# Patient Record
Sex: Male | Born: 1945 | Race: White | Hispanic: No | Marital: Married | Smoking: Former smoker
Health system: Southern US, Community
[De-identification: ages and names within clinical notes are randomized; demographics above are authoritative.]

## PROBLEM LIST (undated history)

## (undated) DIAGNOSIS — I10 Essential (primary) hypertension: Secondary | ICD-10-CM

## (undated) DIAGNOSIS — G8929 Other chronic pain: Secondary | ICD-10-CM

## (undated) DIAGNOSIS — M1990 Unspecified osteoarthritis, unspecified site: Secondary | ICD-10-CM

## (undated) DIAGNOSIS — T7840XA Allergy, unspecified, initial encounter: Secondary | ICD-10-CM

## (undated) DIAGNOSIS — C4490 Unspecified malignant neoplasm of skin, unspecified: Secondary | ICD-10-CM

## (undated) DIAGNOSIS — K5790 Diverticulosis of intestine, part unspecified, without perforation or abscess without bleeding: Secondary | ICD-10-CM

## (undated) DIAGNOSIS — R109 Unspecified abdominal pain: Secondary | ICD-10-CM

## (undated) DIAGNOSIS — E785 Hyperlipidemia, unspecified: Secondary | ICD-10-CM

## (undated) DIAGNOSIS — K589 Irritable bowel syndrome without diarrhea: Secondary | ICD-10-CM

## (undated) DIAGNOSIS — I2510 Atherosclerotic heart disease of native coronary artery without angina pectoris: Secondary | ICD-10-CM

## (undated) DIAGNOSIS — I714 Abdominal aortic aneurysm, without rupture, unspecified: Secondary | ICD-10-CM

## (undated) DIAGNOSIS — J45909 Unspecified asthma, uncomplicated: Secondary | ICD-10-CM

## (undated) DIAGNOSIS — K219 Gastro-esophageal reflux disease without esophagitis: Secondary | ICD-10-CM

## (undated) DIAGNOSIS — H269 Unspecified cataract: Secondary | ICD-10-CM

## (undated) DIAGNOSIS — K635 Polyp of colon: Secondary | ICD-10-CM

## (undated) DIAGNOSIS — Z8679 Personal history of other diseases of the circulatory system: Secondary | ICD-10-CM

## (undated) HISTORY — PX: KNEE SURGERY: SHX244

## (undated) HISTORY — DX: Atherosclerotic heart disease of native coronary artery without angina pectoris: I25.10

## (undated) HISTORY — DX: Other chronic pain: G89.29

## (undated) HISTORY — PX: CHOLECYSTECTOMY: SHX55

## (undated) HISTORY — DX: Essential (primary) hypertension: I10

## (undated) HISTORY — PX: EXPLORATORY LAPAROTOMY: SUR591

## (undated) HISTORY — DX: Unspecified asthma, uncomplicated: J45.909

## (undated) HISTORY — DX: Gastro-esophageal reflux disease without esophagitis: K21.9

## (undated) HISTORY — DX: Abdominal aortic aneurysm, without rupture, unspecified: I71.40

## (undated) HISTORY — DX: Unspecified malignant neoplasm of skin, unspecified: C44.90

## (undated) HISTORY — DX: Personal history of other diseases of the circulatory system: Z86.79

## (undated) HISTORY — DX: Unspecified abdominal pain: R10.9

## (undated) HISTORY — PX: EYE SURGERY: SHX253

## (undated) HISTORY — DX: Unspecified cataract: H26.9

## (undated) HISTORY — DX: Unspecified osteoarthritis, unspecified site: M19.90

## (undated) HISTORY — PX: TENDON REPAIR: SHX5111

## (undated) HISTORY — DX: Abdominal aortic aneurysm, without rupture: I71.4

## (undated) HISTORY — DX: Hyperlipidemia, unspecified: E78.5

## (undated) HISTORY — PX: POLYPECTOMY: SHX149

## (undated) HISTORY — PX: COLONOSCOPY: SHX174

## (undated) HISTORY — DX: Polyp of colon: K63.5

## (undated) HISTORY — DX: Allergy, unspecified, initial encounter: T78.40XA

## (undated) HISTORY — DX: Diverticulosis of intestine, part unspecified, without perforation or abscess without bleeding: K57.90

## (undated) HISTORY — PX: COLONOSCOPY: SHX5424

## (undated) HISTORY — DX: Irritable bowel syndrome, unspecified: K58.9

---

## 1995-06-09 HISTORY — PX: CARDIAC CATHETERIZATION: SHX172

## 1995-06-09 HISTORY — PX: CORONARY ANGIOPLASTY WITH STENT PLACEMENT: SHX49

## 1997-12-13 ENCOUNTER — Inpatient Hospital Stay (HOSPITAL_COMMUNITY): Admission: EM | Admit: 1997-12-13 | Discharge: 1997-12-14 | Payer: Self-pay | Admitting: Cardiology

## 1998-01-03 ENCOUNTER — Ambulatory Visit (HOSPITAL_COMMUNITY): Admission: RE | Admit: 1998-01-03 | Discharge: 1998-01-03 | Payer: Self-pay | Admitting: Internal Medicine

## 1998-02-18 ENCOUNTER — Ambulatory Visit (HOSPITAL_COMMUNITY): Admission: RE | Admit: 1998-02-18 | Discharge: 1998-02-19 | Payer: Self-pay | Admitting: General Surgery

## 1998-05-21 ENCOUNTER — Ambulatory Visit (HOSPITAL_COMMUNITY): Admission: RE | Admit: 1998-05-21 | Discharge: 1998-05-21 | Payer: Self-pay | Admitting: Internal Medicine

## 1999-09-21 ENCOUNTER — Encounter: Payer: Self-pay | Admitting: Internal Medicine

## 1999-09-21 ENCOUNTER — Encounter: Payer: Self-pay | Admitting: Emergency Medicine

## 1999-09-21 ENCOUNTER — Inpatient Hospital Stay (HOSPITAL_COMMUNITY): Admission: EM | Admit: 1999-09-21 | Discharge: 1999-09-23 | Payer: Self-pay | Admitting: Emergency Medicine

## 2005-10-07 ENCOUNTER — Encounter (HOSPITAL_BASED_OUTPATIENT_CLINIC_OR_DEPARTMENT_OTHER): Payer: Self-pay | Admitting: General Surgery

## 2009-03-13 ENCOUNTER — Encounter (INDEPENDENT_AMBULATORY_CARE_PROVIDER_SITE_OTHER): Payer: Self-pay | Admitting: *Deleted

## 2009-10-07 ENCOUNTER — Telehealth: Payer: Self-pay | Admitting: Internal Medicine

## 2010-04-25 ENCOUNTER — Telehealth: Payer: Self-pay | Admitting: Cardiovascular Disease

## 2010-04-25 ENCOUNTER — Encounter: Payer: Self-pay | Admitting: Cardiovascular Disease

## 2010-06-12 ENCOUNTER — Ambulatory Visit
Admission: RE | Admit: 2010-06-12 | Discharge: 2010-06-12 | Payer: Self-pay | Source: Home / Self Care | Attending: Cardiovascular Disease | Admitting: Cardiovascular Disease

## 2010-07-10 NOTE — Progress Notes (Signed)
Summary: need information faxed to Delbert Harness  Phone Note Call from Patient Call back at [facility] Phone [phone]   Caller: Patient Summary of Call: Delbert Harness  [phone] pt need information fax so he can have a MRI and pt also needs a card to carry with him at all times. Initial call taken by: Judie Grieve,  April 25, 2010 11:41 AM  Follow-up for Phone Call        MRI request given to DOD. adv pt that order for mri not signed and will have md sign on monday. pt states he had been seeing Dr. Sherril Croon in  [place] but he has since retired. Pt is considering coming back to [place] for care.  Follow-up by: Claris Gladden RN,  April 25, 2010 11:53 AM  Additional Follow-up for Phone Call Additional follow up Details #1::        fax's approval.  Additional Follow-up by: Claris Gladden RN,  April 28, 2010 9:59 AM

## 2010-07-10 NOTE — Progress Notes (Signed)
Summary: Schedule Colonoscopy  Phone Note Outgoing Call Call back at [facility] Phone [phone]   Call placed by: Harlow Mares CMA Duncan Dull),  Oct 07, 2009 4:55 PM Call placed to: Patient Summary of Call: Left message on patients machine to call back. pt needs to schedule his next colonoscopy Initial call taken by: Harlow Mares CMA Duncan Dull),  Oct 07, 2009 4:55 PM  Follow-up for Phone Call        patient is due for colonoscopy. we will mail him a letter as a reminder.  Follow-up by: Harlow Mares CMA Duncan Dull),  Oct 18, 2009 2:55 PM

## 2010-07-10 NOTE — Progress Notes (Signed)
Summary: pt has stents/ok to have mri?  Phone Note Call from Patient   Caller: Patient [phone] Reason for Call: Talk to Nurse Summary of Call: pt calling re having a stent placement over 10 yrs ago-wants to know if it's ok to have an mri Initial call taken by: Glynda Jaeger,  April 25, 2010 11:14 AM  Follow-up for Phone Call        adv pt ok to have mri. he will have orthopedic md call if any questions.  Follow-up by: Claris Gladden RN,  April 25, 2010 11:31 AM     Appended Document: pt has stents/ok to have mri? Stent in coronary over 10 years ago Ok to have MRI

## 2010-07-10 NOTE — Medication Information (Signed)
Summary: Order for MRI  Order for MRI   Imported By: Marylou Mccoy 05/14/2010 12:33:17  _____________________________________________________________________  External Attachment:    Type:   Image     Comment:   External Document

## 2010-09-18 ENCOUNTER — Other Ambulatory Visit: Payer: Self-pay | Admitting: *Deleted

## 2010-09-18 MED ORDER — ROSUVASTATIN CALCIUM 40 MG PO TABS: 40.0000 mg | ORAL_TABLET | Freq: Every day | ORAL | Status: DC

## 2010-10-21 NOTE — Assessment & Plan Note (Signed)
[facility]                        [place] [facility] OFFICE NOTE   MERVILLE, HIJAZI                    MRN:          [id number]  DATE:06/12/2010                            DOB:          02-13-1946    Kevin Blake is a 65 year old gentleman who is here today to establish  cardiovascular care.  He is transferring care from [facility].  He has the following problem list:  1. Coronary artery disease status post angioplasty and stent placement      in 1997.  No cardiac events since then.  Most recent nuclear stress      test was in June 2009 which showed no evidence of ischemia with      normal ejection fraction and exercise capacity.  2. Hypertension.  3. Hyperlipidemia.  4. Gastroesophageal reflux disease.   CLINICAL HISTORY:  Kevin Blake is transferring his cardiac care.  Overall, he has been doing very well.  Since his cardiac angioplasty in  1997, he had no cardiac events.  He is able to do all his activities of  daily living with no limitations.  He is very active.  He has no chest  pain, dyspnea, palpitations.  No presyncope or syncope.   MEDICATIONS:  1. Lisinopril 10 mg once daily.  2. Ranitidine 150 mg once daily.  3. Niaspan 500 mg once daily.  4. Aspirin 81 mg once daily.  5. Crestor 40 mg once daily.  6. Amitriptyline 50 mg once daily.  7. Fish oil 2000 mg twice daily.  8. Calcium 1200 mg twice daily.   ALLERGIES:  He has no true allergies, but NORVASC cause lower extremity  edema as well as DESIPRAMINE cause the same problem.  ATENOLOL was  stopped due to bradycardia.   SOCIAL HISTORY:  Remarkable for previous smoking.  He quit in 2002.  He  used to smoke 1 pack per day for many years.  Her denies any alcohol or  recreational drug use.  He is retired from [employer] work.   PAST SURGICAL HISTORY:  Knee surgery, cholecystectomy as well as tendon  repair.   FAMILY HISTORY:  Negative for premature coronary  artery disease.  His  father died of renal cancer.   REVIEW OF SYSTEMS:  Remarkable for only occasional stomach discomfort.  A full review of system was performed and is otherwise negative.   PHYSICAL EXAMINATION:  GENERAL:  The patient appears to be at his stated  age and in no acute distress.  VITAL SIGNS:  Weight is 185 pounds, blood pressure is 138/85, pulse is  70, oxygen saturation is 97% on room air.  HEENT:  Normocephalic, atraumatic.  NECK:  No JVD or carotid bruits.  RESPIRATORY:  Normal respiratory effort with no use of accessory  muscles.  Auscultation reveals normal breath sounds.  CARDIOVASCULAR:  Normal PMI.  Normal S1 and S2 with no gallops or murmurs.  ABDOMEN:  Benign, nontender, nondistended.  EXTREMITIES:  With no clubbing, cyanosis or edema.  SKIN:  Warm and dry with no rash.  PSYCHIATRIC:  He is alert, oriented x3 with normal mood and affect.  MUSCULOSKELETAL:  There is normal muscle strength in the upper and lower  extremities.   An electrocardiogram was performed that showed normal sinus rhythm with  right bundle branch block as well as nonspecific T-wave changes in the  anterior leads.   IMPRESSION:  1. History of coronary artery disease status post angioplasty and      stent placement to the right coronary artery in 1997.  No cardiac      events since then.  Most recent nuclear stress test was in June      2009 which was negative.  He subsequently underwent treadmill      stress test which were unremarkable as well.  At this time I      recommend continuing medical therapy with aspirin 81 mg once daily,      lisinopril 10 mg once daily and lipid management with Crestor and      Niaspan.  2. Hypertension:  Blood pressure is reasonably controlled but slightly      on the high side.  We will consider increasing lisinopril in the      future if needed.  3. Hyperlipidemia:  We will request a fasting lipid and liver profile      and will notify the patient  with the results.  He will follow up      with me in 6 months from now or earlier if needed.     Lorine Bears, MD  Electronically Signed    MA/MedQ  DD: 06/12/2010  DT: 06/12/2010  Job #: [id number]

## 2010-10-24 NOTE — Discharge Summary (Signed)
[place]  Patient:    Kevin Blake, Kevin Blake                    MRN: [id number] Adm. Date:  [id number] Disc. Date: [id number] Attending:  Madaline Guthrie Dictator:   Nolon Nations CC:         Earl Many, M.D.             Einar Crow, M.D.             Dr. Shanon Rosser, [place] [facility])                           Discharge Summary  DATE OF BIRTH: 27-May-1946  CONSULTATIONS: Cardiology.  PROCEDURE: Cardiolite study on September 22, 1999.  HISTORY OF PRESENT ILLNESS: Mr. Deman is a 65 year old male with a history of coronary artery disease who presented to the ED with a four day history of chest pain.  The pain started about four days prior to admission with left-sided chest pain and tightness.  This lasted about an hour.  Two days later he then had pain that lasted all day with tightness and nausea.  The pain came on at rest and was not effected by exercise.  The patient had recurrent chest pain on the morning of admission.  He took a nitroglycerin without much relief.  The pain became so severe that his family called EMS and he was transported to the hospital via EMS.  He denied any associated diaphoresis, dizziness, radiation of pain.  He described the pain as gas-like and tight.  The pain is similar to pain that he has had associated with coronary artery disease as well as with gastrointestinal problems in the past.  The patient has a long history of gastrointestinal problems, that have been diagnosed as IBS.  He has had a number of GI work-ups including CT scan, upper GI endoscopy x 2, colonoscopy x 2, as well as barium swallow.  These have revealed no significant abnormalities.  The patient has also had two stents placed in the RCA in 1997.  He had cardiac catheterization in 1997 revealing the right-sided stents were widely patent and the stenotic lesions of the left circumflex, 60-70% lesion in the LAD, were unchanged from  1997.  PAST MEDICAL HISTORY:  1. Coronary artery disease as above.  2. Chronic epigastric pain/IBS.  3. Hypercholesterolemia.  PAST SURGICAL HISTORY:  1. PTCA in 1997.  2. Cardiac catheterization in 1999.  3. 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  4. Biceps tendon repair.  5. Arthroscopic knee surgery.  MEDICATIONS:  1. Metoprolol 25 mg b.i.d.  2. Hyoscyamine 0.125 mg q.4h to q.6h p.r.n.  3. Zoloft 50 mg qd  4. Lipitor 40 mg q.d.  5. Cholestyramine 4 g b.i.d.  6. Aspirin 325 mg q.d.  ALLERGIES: No known drug allergies.  FAMILY HISTORY: Brother with MI in his 8s.  Kidney disease, hyperlipidemia. Aunts and uncles with coronary artery disease.  Parents age 18 and 2, alive and well.  SOCIAL HISTORY: Married, lives with wife in [place].  Works as a Technical sales engineer.  Two children, ages 61 and 69.  Past history of smoking, quit over 20 years ago.  Drinks alcohol occasionally.  No illicit drug use. His 69 year old son is getting married in a couple of months.  PHYSICAL EXAMINATION:  VITAL SIGNS: Temperature 97.9 degrees, blood pressure 134/84, respiratory rate  18, pulse 60.  Oxygen saturation 97% on room air.  GENERAL:  Pleasant middle-aged male, resting comfortably in bed.  HEENT: Head normocephalic, atraumatic.  EOMI.  PERRL.  Conjunctivae clear. Oropharynx patent and moist.  Nares patent.  NECK: Supple, no lymphadenopathy or thyromegaly.  LUNGS: Clear to auscultation bilaterally.  No crackles, wheezes, or rhonchi.  CARDIOVASCULAR: Bradycardia, regular rhythm, normal S1 and S2, no murmurs or rubs. Radial, femoral, dorsalis pedis pulses normal bilaterally.  No JVD.  ABDOMEN: Normoactive bowel sounds, no tenderness, no masses.  EXTREMITIES: No clubbing, cyanosis, or edema.  NEUROLOGIC: Alert and oriented x 4.  Cranial nerves 2-12 intact.  Strength 5/5.  Reflexes 2+ and symmetric.  LABORATORY DATA: Sodium 133, potassium 3.9,  chloride 103, bicarbonate 24, BUN 13, creatinine 1.0, glucose 93.  Calcium 8.6.  WBC 6.4, hemoglobin 15.1, hematocrit 39.8, platelets 189,000.  CK enzymes on admission showed a total of 98 and MB 0.9, relative index 0.9, troponin I less than 0.3.  PT 13.4, INR 1.1, PTT 26 seconds.  EKG showed sinus bradycardia, otherwise normal.  Chest x-ray showed no acute disease.  ASSESSMENT/PLAN: This patient is a 65 year old male with a history of coronary artery disease and chronic epigastric pain, presenting with chest pain.  1. Chest pain.  Must rule out myocardial infarction given history of     coronary artery disease.  Will admit to telemetry and follow cardiac     enzymes, repeat EKG and BMP in the morning.  Continue with home     medications.  Will consult cardiology for work-up.  2. Long history of gastrointestinal problems.  Will continue on home     medications.  Likely source for pain. HOSPITAL COURSE: #1 - CHEST PAIN: The patient was admitted to telemetry and ruled out for MI. He had no arrhythmias and no cardiac problems while in-house.  He had significant improvement in chest pain by the morning of discharge.  Cardiology was consulted and given his cardiac history a stress Cardiolite was ordered. EKG showed no acute changes and no arrhythmia.  He tolerated this well. Cardiology recommendation was for further GI work-up as etiology for chest pain appeared GI and not cardiac in origin.  #2 - GASTROINTESTINAL: Maintained on home medications and the patient was started on Protonix.  Will discharge home on Protonix 40 mg p.o. q.d. with follow-up as an outpatient.  Should this provide relief would recommend long-term treatment with Protonix.  Should it not relieve perhaps Reglan may be of benefit.  DISCHARGE CONDITION: Good.  DISPOSITION: Discharged home.  DISCHARGE MEDICATIONS:  1. Enteric-coated aspirin 325 mg p.o. q.d.  2. Lipitor 40 mg p.o. q.d.   3. Cholestyramine 4 g p.o.  b.i.d.  4. Hyoscyamine 0.125 mg sublingual q.4h to q.6h p.r.n. spasm.  5. Zoloft 50 mg p.o. qd  6. Metoprolol 25 mg p.o. b.i.d.  7. Protonix 40 mg p.o. q.d.  FOLLOW-UP:  1. Follow-up with Dr. Yetta Flock on Oct 09, 1999 at 8:30 a.m.  2. Follow-up with Dr. Sherril Croon to keep previously scheduled appointment.  3. Follow-up with Dr. Chales Abrahams as previously scheduled on September 30, 1999.DD: 09/23/99 TD:  09/23/99 Job: [id number]

## 2010-12-24 ENCOUNTER — Encounter: Payer: Self-pay | Admitting: Cardiovascular Disease

## 2010-12-25 ENCOUNTER — Ambulatory Visit (INDEPENDENT_AMBULATORY_CARE_PROVIDER_SITE_OTHER): Payer: Medicare Other | Admitting: Cardiovascular Disease

## 2010-12-25 ENCOUNTER — Encounter: Payer: Self-pay | Admitting: Cardiovascular Disease

## 2010-12-25 VITALS — BP 129/83 | HR 77 | Ht 68.0 in | Wt 176.0 lb

## 2010-12-25 DIAGNOSIS — I10 Essential (primary) hypertension: Secondary | ICD-10-CM

## 2010-12-25 DIAGNOSIS — I2510 Atherosclerotic heart disease of native coronary artery without angina pectoris: Secondary | ICD-10-CM | POA: Insufficient documentation

## 2010-12-25 DIAGNOSIS — E785 Hyperlipidemia, unspecified: Secondary | ICD-10-CM

## 2010-12-25 NOTE — Assessment & Plan Note (Signed)
His most recent lipid profile in January showed a total cholesterol of 205, triglyceride 106, HDL 48 and an LDL of 136. This was done while he was taking Crestor 40 mg daily as well as niacin 500 mg once daily. I will go ahead and repeat his fasting lipid profile. If his LDL is still above 100, I plan on adding Zakia 10 mg once daily. I discussed with him the importance of healthy diet and regular exercise.

## 2010-12-25 NOTE — Assessment & Plan Note (Signed)
His blood pressure is well controlled. Continue current medications. 

## 2010-12-25 NOTE — Assessment & Plan Note (Signed)
The patient is not having any symptoms suggestive of angina at this time. Continue with medical therapy. Continue aspirin 81 mg daily as well as lipid management. His blood pressure is optimal at this time.

## 2010-12-25 NOTE — Patient Instructions (Signed)
Your physician recommends that you schedule a follow-up appointment in: 6 months  Your physician recommends that you return for lab work today -- lipid and liver profiels

## 2010-12-25 NOTE — Progress Notes (Signed)
HPI  This is a 65 year old gentleman who is here today for a followup visit. He has history of coronary artery disease status post angioplasty and stent placement in 1997. No cardiac events since then. Most recent stress test was in 2009 which showed no evidence of ischemia. He also has history of hypertension and hyperlipidemia. Overall, he has been doing well. He denies any chest pain or dyspnea. His most recent lipid profile was not optimal. He claims that he takes Crestor daily without interruption.  No Known Allergies   Current Outpatient Prescriptions on File Prior to Visit  Medication Sig Dispense Refill  . amitriptyline (ELAVIL) 50 MG tablet Take 50 mg by mouth at bedtime.        Marland Kitchen aspirin 81 MG tablet Take 81 mg by mouth daily.        . Calcium Carbonate-Vit D-Min (CALCIUM 1200 PO) Take by mouth 2 (two) times daily.        . fish oil-omega-3 fatty acids 1000 MG capsule Take 2 g by mouth 2 (two) times daily.        Marland Kitchen lisinopril (PRINIVIL,ZESTRIL) 10 MG tablet Take 10 mg by mouth daily.        . niacin (NIASPAN) 500 MG CR tablet Take 1,000 mg by mouth at bedtime.       . nitroGLYCERIN (NITROSTAT) 0.4 MG SL tablet Place 0.4 mg under the tongue every 5 (five) minutes as needed.        . ranitidine (ZANTAC) 150 MG capsule Take 150 mg by mouth 2 (two) times daily.        . rosuvastatin (CRESTOR) 40 MG tablet Take 1 tablet (40 mg total) by mouth daily.  30 tablet  6     Past Medical History  Diagnosis Date  . GERD (gastroesophageal reflux disease)   . CAD (coronary artery disease)     s/p angioplasty  . HLD (hyperlipidemia)   . HTN (hypertension)      Past Surgical History  Procedure Date  . Knee surgery   . Cholecystectomy   . Tendon repair   . Cardiac catheterization 1997  . Coronary angioplasty with stent placement 1997     Family History  Problem Relation Age of Onset  . Cancer       History   Social History  . Marital Status: Married    Spouse Name: N/A   Number of Children: 2  . Years of Education: N/A   Occupational History  . retired    Social History Main Topics  . Smoking status: Former Smoker -- 1.0 packs/day    Quit date: 06/08/2000  . Smokeless tobacco: Not on file  . Alcohol Use: No  . Drug Use: No  . Sexually Active: Not on file   Other Topics Concern  . Not on file   Social History Narrative  . No narrative on file      PHYSICAL EXAM   BP 129/83  Pulse 77  Ht 5\' 8"  (1.727 m)  Wt 176 lb (79.833 kg)  BMI 26.76 kg/m2  SpO2 94%  Constitutional: He is oriented to person, place, and time. He appears well-developed and well-nourished. No distress.  HENT: No nasal discharge.  Head: Normocephalic and atraumatic.  Eyes: Pupils are equal, round, and reactive to light. Right eye exhibits no discharge. Left eye exhibits no discharge.  Neck: Normal range of motion. Neck supple. No JVD present. No thyromegaly present.  Cardiovascular: Normal rate, regular rhythm, normal heart sounds and intact  distal pulses. Exam reveals no gallop and no friction rub.  No murmur heard.  Pulmonary/Chest: Effort normal and breath sounds normal. No stridor. No respiratory distress. He has no wheezes. He has no rales. He exhibits no tenderness.  Abdominal: Soft. Bowel sounds are normal. He exhibits no distension. There is no tenderness. There is no rebound and no guarding.  Musculoskeletal: Normal range of motion. He exhibits no edema and no tenderness.  Neurological: He is alert and oriented to person, place, and time. Coordination normal.  Skin: Skin is warm and dry. No rash noted. He is not diaphoretic. No erythema. No pallor.  Psychiatric: He has a normal mood and affect. His behavior is normal. Judgment and thought content normal.        ASSESSMENT AND PLAN

## 2010-12-29 ENCOUNTER — Encounter: Payer: Self-pay | Admitting: Cardiovascular Disease

## 2011-01-19 ENCOUNTER — Other Ambulatory Visit: Payer: Self-pay | Admitting: *Deleted

## 2011-01-19 MED ORDER — LISINOPRIL 10 MG PO TABS: 10.0000 mg | ORAL_TABLET | Freq: Every day | ORAL | Status: DC

## 2011-02-12 ENCOUNTER — Other Ambulatory Visit: Payer: Self-pay | Admitting: *Deleted

## 2011-02-12 MED ORDER — NIACIN ER (ANTIHYPERLIPIDEMIC) 500 MG PO TBCR: 1000.0000 mg | EXTENDED_RELEASE_TABLET | Freq: Every day | ORAL | Status: DC

## 2011-04-29 ENCOUNTER — Other Ambulatory Visit: Payer: Self-pay | Admitting: Cardiovascular Disease

## 2011-04-29 MED ORDER — ROSUVASTATIN CALCIUM 40 MG PO TABS: 40.0000 mg | ORAL_TABLET | Freq: Every day | ORAL | Status: DC

## 2011-06-16 ENCOUNTER — Ambulatory Visit (INDEPENDENT_AMBULATORY_CARE_PROVIDER_SITE_OTHER): Payer: Medicare Other | Admitting: Cardiovascular Disease

## 2011-06-16 ENCOUNTER — Encounter: Payer: Self-pay | Admitting: Cardiovascular Disease

## 2011-06-16 DIAGNOSIS — Z79899 Other long term (current) drug therapy: Secondary | ICD-10-CM

## 2011-06-16 DIAGNOSIS — E785 Hyperlipidemia, unspecified: Secondary | ICD-10-CM

## 2011-06-16 DIAGNOSIS — I10 Essential (primary) hypertension: Secondary | ICD-10-CM

## 2011-06-16 DIAGNOSIS — I2510 Atherosclerotic heart disease of native coronary artery without angina pectoris: Secondary | ICD-10-CM

## 2011-06-16 LAB — HEPATIC FUNCTION PANEL
ALT: 17 U/L (ref 0–53)
AST: 24 U/L (ref 0–37)
Alkaline Phosphatase: 60 U/L (ref 39–117)
Total Bilirubin: 0.7 mg/dL (ref 0.3–1.2)

## 2011-06-16 LAB — LIPID PANEL
HDL: 41.6 mg/dL (ref >39.00)
Total CHOL/HDL Ratio: 4
Triglycerides: 84 mg/dL (ref 0.0–149.0)

## 2011-06-16 MED ORDER — NITROGLYCERIN 0.4 MG SL SUBL: 0.4000 mg | SUBLINGUAL_TABLET | SUBLINGUAL | Status: DC | PRN

## 2011-06-16 NOTE — Progress Notes (Signed)
This is a 66 year old gentleman who is here today for a followup visit. He has history of coronary artery disease status post angioplasty and stent placement in 1997. No cardiac events since then. Most recent stress test was in 2009 which showed no evidence of ischemia. He also has history of hypertension and hyperlipidemia. Overall, he has been doing well. He denies any chest pain or dyspnea. Las LDL was 100 in July.  He wants repeat blood work today.  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  Sees Dr Maisie Fus in [place] as primary.    ROS: Denies fever, malais, weight loss, blurry vision, decreased visual acuity, cough, sputum, SOB, hemoptysis, pleuritic pain, palpitaitons, heartburn, abdominal pain, melena, lower extremity edema, claudication, or rash.  All other systems reviewed and negative  General: Affect appropriate Healthy:  appears stated age HEENT: normal Neck supple with no adenopathy JVP normal no bruits no thyromegaly Lungs clear with no wheezing and good diaphragmatic motion Heart:  S1/S2 no murmur,rub, gallop or click PMI normal Abdomen: benighn, BS positve, no tenderness, no AAA no bruit.  No HSM or HJR Distal pulses intact with no bruits No edema Neuro non-focal Skin warm and dry No muscular weakness   Current Outpatient Prescriptions  Medication Sig Dispense Refill  . aspirin 81 MG tablet Take 81 mg by mouth daily.        Marland Kitchen azaTHIOprine (IMURAN) 50 MG tablet Take 1 tablet by mouth Twice daily.      . fish oil-omega-3 fatty acids 1000 MG capsule Take 2 g by mouth 2 (two) times daily.        Marland Kitchen lisinopril (PRINIVIL,ZESTRIL) 10 MG tablet Take 1 tablet (10 mg total) by mouth daily.  30 tablet  5  . nitroGLYCERIN (NITROSTAT) 0.4 MG SL tablet Place 0.4 mg under the tongue every 5 (five) minutes as needed.        . rosuvastatin (CRESTOR) 40 MG tablet Take 1 tablet (40 mg total) by mouth daily.  30 tablet  6  . niacin (NIASPAN) 500 MG  CR tablet Take 1,000 mg by mouth at bedtime. (NOT TAKING)         Allergies  Lipitor  Electrocardiogram: NSR rate 61 LAD otherwise normal ECG  Assessment and Plan

## 2011-06-16 NOTE — Assessment & Plan Note (Signed)
Cholesterol is at goal.  Continue current dose of statin and diet Rx.  No myalgias or side effects.  F/U  LFT's in 6 months. No results found for this basename: LDLCALC   Labs today.  LDL 100 7/12

## 2011-06-16 NOTE — Assessment & Plan Note (Signed)
Stable with no angina and good activity level.  Continue medical Rx Nitro called into [place] pharmacy

## 2011-06-16 NOTE — Assessment & Plan Note (Signed)
Well controlled.  Continue current medications and low sodium Dash type diet.    

## 2011-06-16 NOTE — Patient Instructions (Signed)
Your physician wants you to follow-up in: YEAR WITH DR NISHAN  You will receive a reminder letter in the mail two months in advance. If you don't receive a letter, please call our office to schedule the follow-up appointment. Your physician recommends that you continue on your current medications as directed. Please refer to the Current Medication list given to you today.  Your physician recommends that you return for lab work in: TODAY LIPID LIVER  DX [phone] [phone] 

## 2011-06-17 ENCOUNTER — Encounter: Payer: Self-pay | Admitting: *Deleted

## 2011-07-23 ENCOUNTER — Other Ambulatory Visit: Payer: Self-pay | Admitting: Cardiovascular Disease

## 2011-07-27 ENCOUNTER — Other Ambulatory Visit: Payer: Self-pay | Admitting: *Deleted

## 2011-07-27 MED ORDER — LISINOPRIL 10 MG PO TABS: 10.0000 mg | ORAL_TABLET | Freq: Every day | ORAL | Status: DC

## 2011-07-27 NOTE — Telephone Encounter (Signed)
FU Call: Pt calling to check on status of pt lisinopril. Please call this RX in ASAP.

## 2011-12-03 ENCOUNTER — Other Ambulatory Visit: Payer: Self-pay | Admitting: *Deleted

## 2011-12-03 MED ORDER — ROSUVASTATIN CALCIUM 40 MG PO TABS: 40.0000 mg | ORAL_TABLET | Freq: Every day | ORAL | Status: DC

## 2011-12-18 ENCOUNTER — Encounter: Payer: Self-pay | Admitting: Internal Medicine

## 2012-02-05 ENCOUNTER — Other Ambulatory Visit: Payer: Self-pay | Admitting: Cardiovascular Disease

## 2012-02-05 MED ORDER — LISINOPRIL 10 MG PO TABS: 10.0000 mg | ORAL_TABLET | Freq: Every day | ORAL | Status: DC

## 2012-08-09 ENCOUNTER — Other Ambulatory Visit: Payer: Self-pay | Admitting: *Deleted

## 2012-08-09 MED ORDER — LISINOPRIL 10 MG PO TABS: 10.0000 mg | ORAL_TABLET | Freq: Every day | ORAL | Status: DC

## 2012-08-22 ENCOUNTER — Other Ambulatory Visit: Payer: Self-pay | Admitting: *Deleted

## 2012-08-22 MED ORDER — ROSUVASTATIN CALCIUM 40 MG PO TABS: 40.0000 mg | ORAL_TABLET | Freq: Every day | ORAL | Status: DC

## 2012-09-27 ENCOUNTER — Telehealth: Payer: Self-pay | Admitting: Cardiovascular Disease

## 2012-09-27 NOTE — Telephone Encounter (Signed)
SPOKE WITH PT NEEDING  DOCUMENTATION THAT PT HAS CAD  REVIEWED LAST OFFICE NOTE IS MENTIONED  IN DICTATION  WILL MAIL PT COPY OF LETTER AND  IS AWARE .Zack Seal

## 2012-09-27 NOTE — Telephone Encounter (Signed)
New Prob     Pt is thinking about contacting the [place], and he needs a verification that he has heart disease and being treated by Dr. Eden Emms.

## 2013-01-06 ENCOUNTER — Encounter: Payer: Self-pay | Admitting: Cardiovascular Disease

## 2013-01-06 ENCOUNTER — Ambulatory Visit (INDEPENDENT_AMBULATORY_CARE_PROVIDER_SITE_OTHER): Payer: Medicare Other | Admitting: Cardiovascular Disease

## 2013-01-06 VITALS — BP 122/72 | HR 55 | Ht 68.0 in | Wt 180.0 lb

## 2013-01-06 DIAGNOSIS — Z79899 Other long term (current) drug therapy: Secondary | ICD-10-CM

## 2013-01-06 DIAGNOSIS — I10 Essential (primary) hypertension: Secondary | ICD-10-CM

## 2013-01-06 DIAGNOSIS — Z0000 Encounter for general adult medical examination without abnormal findings: Secondary | ICD-10-CM

## 2013-01-06 DIAGNOSIS — E785 Hyperlipidemia, unspecified: Secondary | ICD-10-CM

## 2013-01-06 DIAGNOSIS — I2510 Atherosclerotic heart disease of native coronary artery without angina pectoris: Secondary | ICD-10-CM

## 2013-01-06 DIAGNOSIS — K439 Ventral hernia without obstruction or gangrene: Secondary | ICD-10-CM

## 2013-01-06 LAB — LIPID PANEL
Cholesterol: 162 mg/dL (ref 0–200)
HDL: 43.2 mg/dL (ref >39.00)
Triglycerides: 96 mg/dL (ref 0.0–149.0)
VLDL: 19.2 mg/dL (ref 0.0–40.0)

## 2013-01-06 LAB — HEPATIC FUNCTION PANEL
ALT: 26 U/L (ref 0–53)
AST: 27 U/L (ref 0–37)
Albumin: 4.1 g/dL (ref 3.5–5.2)
Alkaline Phosphatase: 61 U/L (ref 39–117)
Total Protein: 6.9 g/dL (ref 6.0–8.3)

## 2013-01-06 MED ORDER — NITROGLYCERIN 0.4 MG SL SUBL: 0.4000 mg | SUBLINGUAL_TABLET | SUBLINGUAL | Status: DC | PRN

## 2013-01-06 NOTE — Assessment & Plan Note (Signed)
Well controlled.  Continue current medications and low sodium Dash type diet.    

## 2013-01-06 NOTE — Progress Notes (Signed)
Patient ID: Kevin Blake, male   DOB: 02-Apr-1946, 67 y.o.   MRN: [id number] This is a 67 year old gentleman who is here today for a followup visit. He has history of coronary artery disease status post angioplasty and stent placement in 1997. No cardiac events since then. Most recent stress test was in 2009 which showed no evidence of ischemia. He also has history of hypertension and hyperlipidemia. Overall, he has been doing well. He denies any chest pain or dyspnea. Las LDL was 100 in July. He wants repeat blood work today. 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 Sees Dr Maisie Fus in [place] as primary.  Has a small ventral hernia and asking advise about this  ROS: Denies fever, malais, weight loss, blurry vision, decreased visual acuity, cough, sputum, SOB, hemoptysis, pleuritic pain, palpitaitons, heartburn, abdominal pain, melena, lower extremity edema, claudication, or rash.  All other systems reviewed and negative  General: Affect appropriate Healthy:  appears stated age HEENT: normal Neck supple with no adenopathy JVP normal no bruits no thyromegaly Lungs clear with no wheezing and good diaphragmatic motion Heart:  S1/S2 no murmur, no rub, gallop or click PMI normal Abdomen: benighn, BS positve, no tenderness, no AAA small reducable ventral hernia no bruit.  No HSM or HJR Distal pulses intact with no bruits No edema Neuro non-focal Skin warm and dry No muscular weakness   Current Outpatient Prescriptions  Medication Sig Dispense Refill  . aspirin 81 MG tablet Take 81 mg by mouth daily.        . fish oil-omega-3 fatty acids 1000 MG capsule Take 2 g by mouth 2 (two) times daily.        Marland Kitchen lisinopril (PRINIVIL,ZESTRIL) 10 MG tablet Take 1 tablet (10 mg total) by mouth daily.  30 tablet  5  . rosuvastatin (CRESTOR) 40 MG tablet Take 1 tablet (40 mg total) by mouth daily.  30 tablet  6  . nitroGLYCERIN (NITROSTAT) 0.4 MG SL tablet Place 1  tablet (0.4 mg total) under the tongue every 5 (five) minutes as needed.  25 tablet  4   No current facility-administered medications for this visit.    Allergies  Lipitor  Electrocardiogram:  SB rate 55 normal otherwise  Assessment and Plan

## 2013-01-06 NOTE — Assessment & Plan Note (Signed)
Small and reducable no need for surgery now

## 2013-01-06 NOTE — Assessment & Plan Note (Signed)
Refill on nitro called in [place] with no chest pain stable

## 2013-01-06 NOTE — Assessment & Plan Note (Signed)
Labs today Low fat diet  Continue statin

## 2013-01-06 NOTE — Patient Instructions (Signed)
Your physician wants you to follow-up in:   YEAR WITH DR Haywood Filler will receive a reminder letter in the mail two months in advance. If you don't receive a letter, please call our office to schedule the follow-up appointment. Your physician recommends that you continue on your current medications as directed. Please refer to the Current Medication list given to you today. Your physician recommends that you return for lab work in: TODAY FASTING LIPID LIVER  HGBA1C GLUCOSE

## 2013-01-25 ENCOUNTER — Telehealth: Payer: Self-pay | Admitting: Cardiovascular Disease

## 2013-01-25 DIAGNOSIS — I714 Abdominal aortic aneurysm, without rupture, unspecified: Secondary | ICD-10-CM

## 2013-01-25 NOTE — Telephone Encounter (Signed)
New Problem  Pt has had 2 aneurisms in the past or recently and is requesting an ultrasound/ please assist .

## 2013-01-25 NOTE — Telephone Encounter (Signed)
PT  CALLING WOULD  LIKE  YOU  TO FOLLOW  HIS  AAA  PT  THINKS  HE IS DUE FOR  AN ABD U/S  HAD BEEN FOLLOWED IN [place]  PER PT FEELS  HE IS GETTING TH RUN AROUND .Zack Seal

## 2013-01-26 NOTE — Telephone Encounter (Signed)
Can order abdominal US for AAA

## 2013-01-27 NOTE — Telephone Encounter (Signed)
PT AWARE WILL FOLLOW  AAA  ORDER ENTERED  PT AWARE SCHEDULERS TO CALL  WITH  AN APPT .CY

## 2013-02-03 ENCOUNTER — Encounter (INDEPENDENT_AMBULATORY_CARE_PROVIDER_SITE_OTHER): Payer: Medicare Other

## 2013-02-03 DIAGNOSIS — I714 Abdominal aortic aneurysm, without rupture: Secondary | ICD-10-CM

## 2013-02-03 DIAGNOSIS — I700 Atherosclerosis of aorta: Secondary | ICD-10-CM

## 2013-02-03 DIAGNOSIS — I723 Aneurysm of iliac artery: Secondary | ICD-10-CM

## 2013-02-09 ENCOUNTER — Telehealth: Payer: Self-pay | Admitting: Cardiovascular Disease

## 2013-02-09 DIAGNOSIS — I723 Aneurysm of iliac artery: Secondary | ICD-10-CM

## 2013-02-09 NOTE — Telephone Encounter (Signed)
Follow Up  Pt returning calling for ultrasound results.

## 2013-02-09 NOTE — Telephone Encounter (Signed)
Pt aware of results by phone. Will refer the pt to [facility].  The pt has followed with a Vascular doctor in [facility] but has wanted to transition his care to [facility]. I made the pt aware that he will need to obtain his records from previous vascular doctor in preparation for new vascular appointment.

## 2013-02-14 ENCOUNTER — Other Ambulatory Visit: Payer: Self-pay

## 2013-02-14 MED ORDER — LISINOPRIL 10 MG PO TABS: 10.0000 mg | ORAL_TABLET | Freq: Every day | ORAL | Status: DC

## 2013-03-07 ENCOUNTER — Other Ambulatory Visit: Payer: Self-pay | Admitting: Cardiovascular Disease

## 2013-03-07 DIAGNOSIS — I723 Aneurysm of iliac artery: Secondary | ICD-10-CM

## 2013-03-24 ENCOUNTER — Encounter: Payer: Self-pay | Admitting: Surgery

## 2013-03-27 ENCOUNTER — Ambulatory Visit (INDEPENDENT_AMBULATORY_CARE_PROVIDER_SITE_OTHER): Payer: Medicare Other | Admitting: Surgery

## 2013-03-27 ENCOUNTER — Encounter: Payer: Self-pay | Admitting: Surgery

## 2013-03-27 ENCOUNTER — Other Ambulatory Visit (HOSPITAL_COMMUNITY): Payer: Medicare Other

## 2013-03-27 VITALS — BP 162/87 | HR 57 | Ht 68.0 in | Wt 177.7 lb

## 2013-03-27 DIAGNOSIS — Z01810 Encounter for preprocedural cardiovascular examination: Secondary | ICD-10-CM

## 2013-03-27 DIAGNOSIS — I723 Aneurysm of iliac artery: Secondary | ICD-10-CM | POA: Insufficient documentation

## 2013-03-27 DIAGNOSIS — I714 Abdominal aortic aneurysm, without rupture, unspecified: Secondary | ICD-10-CM

## 2013-03-27 DIAGNOSIS — I716 Thoracoabdominal aortic aneurysm, without rupture, unspecified: Secondary | ICD-10-CM

## 2013-03-27 NOTE — Progress Notes (Signed)
[facility]   Patient name: Kevin Blake MRN: [id number] DOB: 1946-05-04 Sex: male   Referred by: Dr. Eden Emms  Reason for referral:  Chief Complaint  Patient presents with  . 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  His aneurysm was detected many years ago and had been followed and [place].  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  The patient denies any abdominal or back pain.  The patient has a history of hypercholesterolemia.  He is currently taking a statin.  He has a history of hypertension which is well-controlled.  He is on an ACE inhibitor.  He suffers from coronary artery disease.  He is status post stenting and 1997.  At that time he also quit smoking.  He is  Past Medical History  Diagnosis Date  . GERD (gastroesophageal reflux disease)   . CAD (coronary artery disease)     s/p angioplasty  . HLD (hyperlipidemia)   . HTN (hypertension)   . Cancer     skin    Past Surgical History  Procedure Laterality Date  . Knee surgery    . Cholecystectomy    . Tendon repair    . Cardiac catheterization  1997  . Coronary angioplasty with stent placement  1997    History   Social History  . Marital Status: Married    Spouse Name: N/A    Number of Children: 2  . Years of Education: N/A   Occupational History  . retired    Social History Main Topics  . Smoking status: Former Smoker -- 1.00 packs/day    Quit date: 06/08/2000  . Smokeless tobacco: Not on file  . Alcohol Use: No  . Drug Use: No  . Sexual Activity: Not on file   Other Topics Concern  . Not on file   Social History Narrative  . No narrative on file    Family History  Problem Relation Age of Onset  . Cancer    . Cancer Mother   . AAA (abdominal aortic aneurysm)  Mother   . Cancer Father   . Heart disease Brother     before age 2  . Hyperlipidemia Brother   . Hypertension Brother   . Heart disease Son     Allergies as of 03/27/2013 - Review Complete 03/27/2013  Allergen Reaction Noted  . Lipitor [atorvastatin calcium]  06/16/2011    Current Outpatient Prescriptions on File Prior to Visit  Medication Sig Dispense Refill  . aspirin 81 MG tablet Take 81 mg by mouth daily.        Marland Kitchen lisinopril (PRINIVIL,ZESTRIL) 10 MG tablet Take 1 tablet (10 mg total) by mouth daily.  30 tablet  5  . nitroGLYCERIN (NITROSTAT) 0.4 MG SL tablet Place 1 tablet (0.4 mg total) under the tongue every 5 (five) minutes as needed.  25 tablet  4  . rosuvastatin (CRESTOR) 40 MG tablet Take 1 tablet (40 mg total) by mouth daily.  30 tablet  6  . fish oil-omega-3 fatty acids 1000 MG capsule Take 2 g by mouth 2 (two) times daily.         No current facility-administered medications on file prior to visit.     REVIEW OF SYSTEMS: Cardiovascular: No chest pain, chest pressure, palpitations,  orthopnea, or dyspnea on exertion. No claudication or rest pain,  No history of DVT or phlebitis. Pulmonary: No productive cough, asthma or wheezing. Neurologic: No weakness, paresthesias, aphasia, or amaurosis. No dizziness. Hematologic: No bleeding problems or clotting disorders. Musculoskeletal: No joint pain or joint swelling. Gastrointestinal: No blood in stool or hematemesis Genitourinary: No dysuria or hematuria. Psychiatric:: No history of major depression. Integumentary: No rashes or ulcers. Constitutional: No fever or chills.  PHYSICAL EXAMINATION: General: The patient appears their stated age.  Vital signs are BP 162/87  Pulse 57  Ht 5\' 8"  (1.727 m)  Wt 177 lb 11.2 oz (80.604 kg)  BMI 27.03 kg/m2  SpO2 100% HEENT:  No gross abnormalities Pulmonary: Respirations are non-labored Abdomen: Soft and non-tender.  Umbilical hernia Musculoskeletal: There are no major  deformities.   Neurologic: No focal weakness or paresthesias are detected, Skin: There are no ulcers  noted.  Positive for rash Psychiatric: The patient has normal affect. Cardiovascular: There is a regular rate and rhythm without significant murmur appreciated.  No carotid bruits.  Palpable bilateral pedal, popliteal, and femoral pulses.  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  Ideally I would like to address the abdominal component at the same time.  The patient will need to undergo CT angiography to better define his anatomy.  In addition I am going to obtain carotid duplex studies as well as lower extremity duplex studies to evaluate him for carotid stenosis and possible lower extremity aneurysmal changes.  The studies will be performed in the immediate future, and the patient will followup with me once they had been completed.     Jorge Ny, M.D. [facility] [place] Office: [phone] Pager:  [phone]

## 2013-03-28 NOTE — Addendum Note (Signed)
Addended by: Sharee Pimple on: 03/28/2013 07:42 AM   Modules accepted: Orders

## 2013-03-29 ENCOUNTER — Other Ambulatory Visit: Payer: Self-pay | Admitting: *Deleted

## 2013-03-29 ENCOUNTER — Other Ambulatory Visit: Payer: Self-pay | Admitting: Cardiovascular Disease

## 2013-03-29 DIAGNOSIS — I739 Peripheral vascular disease, unspecified: Secondary | ICD-10-CM

## 2013-03-31 ENCOUNTER — Encounter: Payer: Self-pay | Admitting: Surgery

## 2013-03-31 ENCOUNTER — Ambulatory Visit
Admission: RE | Admit: 2013-03-31 | Discharge: 2013-03-31 | Disposition: A | Discharge status: Home / Self Care | Payer: Medicare Other | Source: Ambulatory Visit | Attending: Surgery | Admitting: Surgery

## 2013-03-31 DIAGNOSIS — I716 Thoracoabdominal aortic aneurysm, without rupture, unspecified: Secondary | ICD-10-CM

## 2013-03-31 DIAGNOSIS — I723 Aneurysm of iliac artery: Secondary | ICD-10-CM

## 2013-03-31 DIAGNOSIS — Z01810 Encounter for preprocedural cardiovascular examination: Secondary | ICD-10-CM

## 2013-03-31 DIAGNOSIS — I714 Abdominal aortic aneurysm, without rupture, unspecified: Secondary | ICD-10-CM

## 2013-03-31 MED ORDER — IOHEXOL 350 MG/ML SOLN
80.0000 mL | Freq: Once | INTRAVENOUS | Status: AC | PRN
  Administered 2013-03-31: 80 mL via INTRAVENOUS

## 2013-04-03 ENCOUNTER — Ambulatory Visit (INDEPENDENT_AMBULATORY_CARE_PROVIDER_SITE_OTHER)
Admission: RE | Admit: 2013-04-03 | Discharge: 2013-04-03 | Disposition: A | Discharge status: Home / Self Care | Payer: Medicare Other | Source: Ambulatory Visit | Attending: Surgery | Admitting: Surgery

## 2013-04-03 ENCOUNTER — Ambulatory Visit (INDEPENDENT_AMBULATORY_CARE_PROVIDER_SITE_OTHER): Payer: Medicare Other | Admitting: Surgery

## 2013-04-03 ENCOUNTER — Other Ambulatory Visit: Payer: Self-pay | Admitting: Surgery

## 2013-04-03 ENCOUNTER — Ambulatory Visit (HOSPITAL_COMMUNITY)
Admission: RE | Admit: 2013-04-03 | Discharge: 2013-04-03 | Disposition: A | Discharge status: Home / Self Care | Payer: Medicare Other | Source: Ambulatory Visit | Attending: Surgery | Admitting: Surgery

## 2013-04-03 ENCOUNTER — Encounter: Payer: Self-pay | Admitting: Surgery

## 2013-04-03 VITALS — BP 158/84 | HR 52 | Ht 68.0 in | Wt 177.0 lb

## 2013-04-03 DIAGNOSIS — I739 Peripheral vascular disease, unspecified: Secondary | ICD-10-CM

## 2013-04-03 DIAGNOSIS — Z01810 Encounter for preprocedural cardiovascular examination: Secondary | ICD-10-CM

## 2013-04-03 DIAGNOSIS — I714 Abdominal aortic aneurysm, without rupture, unspecified: Secondary | ICD-10-CM | POA: Insufficient documentation

## 2013-04-03 DIAGNOSIS — I723 Aneurysm of iliac artery: Secondary | ICD-10-CM

## 2013-04-03 NOTE — Progress Notes (Signed)
The patient comes back today for review of additional imaging that I had requested.  He recently had an ultrasound that showed a progression of his right common iliac aneurysm, now measuring 3.1 cm.  I sent him for multiple CAT scans in anticipation of operative repair.  He is back to discuss these results which will are as follows:  CT angiogram, chest: No thoracic aortic aneurysm CT anterior, abdomen and pelvis: Stable 3.7 cm abdominal aortic aneurysm.  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  Carotid artery duplex: Less than 40% bilateral internal carotid stenosis Ankle-brachial indices: 1.1 on the right and 1.0 on the left, both with triphasic waveforms. Lower extremity duplex: No evidence of popliteal aneurysm.  I spent approximately 30 minutes reviewing the results and discussing them with the patient and his wife.  Based on the new diameter measurements of his iliac and aortic aneurysms, I have recommended surveillance in one year with an ultrasound.

## 2013-04-04 NOTE — Addendum Note (Signed)
Addended by: Sharee Pimple on: 04/04/2013 08:18 AM   Modules accepted: Orders

## 2013-08-21 ENCOUNTER — Other Ambulatory Visit: Payer: Self-pay | Admitting: Cardiovascular Disease

## 2013-09-20 NOTE — Telephone Encounter (Signed)
error 

## 2013-11-12 NOTE — ED Provider Notes (Signed)
 ------------------------------------------------------------------------------- Attestation signed by Veria Arley SAILOR, MD at 11/12/13 1628 I was the supervising physician in the delivery of the service.  I have reviewed the H&P and agree with the assessment and plan in the note.  -------------------------------------------------------------------------------  [facility] eMERGENCY dEPARTMENT eNCOUnter     CLINICAL IMPRESSION Final diagnoses:  Tick borne fever (Primary)  Myalgia    ASSESSMENT & PLAN  68 y.o. male with PMH of IBS, diverticulitis, and CAD who presents with 1 week history of vague tick-borne related symptoms including joint pain, chills, diarrhea, headache, stiff neck, and RUQ abdominal pain. Physical exam remarkable for URI findings including post nasal drip, erythematous nares, and mild RUQ tenderness. Strength and sensation intact in all extremities.  Differential includes [facility] Spotted Fever vs. Lyme Disease. Plan for CBC, CMP, lipase, and tick-borne illness panel.  CBC, CMP, and lipase unremarkable, see below for further details.  Tick-borne illness panel will be reviewed with patient upon return.  Will presumptively treat tick-borne illness with Doxycycline.  Discussed with patient importance of f/u with PCP.  Discussed plan of care and discharge instructions.  Verbalized agreement with and understanding of plan of care.  Instructed to return to the emergency department if symptoms worsen, do not improve, or immediately if any concerning symptoms develop.    _______________________________________________________________  Time seen: November 12, 2013 12:00 PM  I have reviewed the triage vital signs and the nursing notes.  CHIEF COMPLAINT   Chief Complaint  Patient presents with  . Generalized Body Aches  . Diarrhea    HPI  Kevin Blake is a 68 y.o. male with PMH of IBS, diverticulitis, and CAD who presents with 1 week history of vague tick-borne related symptoms  including joint pain, chills, diarrhea, headache, stiff neck, and RUQ abdominal pain.  Patient reports that he has found 10-15 ticks on his body over the past month, all of which he believes he has removed. He complains of shoulder and back pain that began last weekend, which his wife says has caused the patient to be more sedentary recently. Patient also reports mild RUQ pain, which is different than his chronic abdominal pain in that it is located higher. He states that he has a decreased appetite recently and that not eating is the only thing alleviates his abdominal pain. Patient reports a cholecystectomy and denies history of pancreatitis. He also had a colonoscopy 3 weeks ago that he reports was relatively unremarkable.   PAST MEDICAL HISTORY   Past Medical History  Diagnosis Date  . IBS (irritable bowel syndrome)   . Diverticulitis   . CAD (coronary artery disease)     SURGICAL HISTORY   Past Surgical History  Procedure Laterality Date  . Coronary angioplasty with stent placement    . Knee surgery    . Elbow surgery    . Cholecystectomy      CURRENT MEDICATIONS   Current Outpatient Rx  Name  Route  Sig  Dispense  Refill  . aspirin , buffered 81 mg Tab   Oral   Take 81 mg by mouth.         . DOCOSAHEXANOIC ACID/EPA (FISH OIL ORAL)   Oral   Take by mouth.         . rosuvastatin  (CRESTOR ) 40 MG tablet   Oral   Take 40 mg by mouth daily.         SABRA DISCONTD: atorvastatin  (LIPITOR) 20 MG tablet   Oral   Take 20 mg by mouth.         SABRA  DISCONTD: clonazePAM (KLONOPIN) 0.5 MG tablet   Oral   Take 0.5 mg by mouth.         . doxycycline (VIBRAMYCIN) 100 MG capsule   Oral   Take 1 capsule (100 mg total) by mouth Two (2) times a day. for 10 days   20 capsule   0     ALLERGIES   Not on File  FAMILY HISTORY   No family history on file.  SOCIAL HISTORY History   Social History  . Marital Status: Married    Spouse Name: N/A    Number of Children: N/A  .  Years of Education: N/A   Social History Main Topics  . Smoking status: Former Games developer  . Smokeless tobacco: None  . Alcohol Use: Yes     Comment: rarely  . Drug Use: No  . Sexual Activity: None   Other Topics Concern  . None   Social History Narrative  . None    REVIEW OF SYSTEMS  Constitutional: + chills. Negative for fever. HENT: Negative for sore throat. Eyes: Negative for visual changes. Cardiovascular: Negative for chest pain. Respiratory: Negative for shortness of breath. Gastrointestinal: + RUQ pain, diarrhea. Negative for vomiting. Genitourinary: Negative for dysuria. Musculoskeletal: + shoulder, back pain. Skin: Negative for rash. Neurological: + headaches. Negative for weakness or numbness.  10 point ROS negative except as marked above and in HPI.  PHYSICAL EXAM    VITAL SIGNS:   ED Triage Vitals  Enc Vitals Group     BP 11/12/13 1136 141/87 mmHg     Heart Rate 11/12/13 1136 57     Resp 11/12/13 1136 18     Temp 11/12/13 1136 36.7 C (98.1 F)     Temp Source 11/12/13 1136 Oral     SpO2 11/12/13 1136 97 %     Weight 11/12/13 1136 76.431 kg (168 lb 8 oz)     Height 11/12/13 1136 1.727 m (5' 8)     Constitutional: Alert and oriented. NAD. HEENT      Head: No tenderness over maxillary or frontal sinuses. Normocephalic and atraumatic.      Eyes: EOMI. PERRL. No scleral icterus.       Ears: normal external ears and auditory canals bilat.  Normal TM bilat.      Nose: Mild nasal drainage and mild erythema to bilateral nares.      Mouth/Throat: Mild postnasal drip. Normal voice. Mucous membranes are moist. Oropharynx normal  Tonsils nl.  No peritonsilar mass or swelling.   Neck: No cervical lymphadenopathy.  Neck supple. Cardiovascular: Regular rate and rhythm. Normal S1, S2.  No murmur.  Good distal peripheral pulses. Pulmonary/Chest: Normal respiratory effort. Lungs CTAB with no rales, rhonchi, or wheeze.  Good air movement. Abdominal: + BS in all 4  quadrants. Tenderness to RUQ and otherwise non-tender. Soft and non-distended. NT. No CVA tenderness Extremities: Normal ROM and strength in all extremities. NT, no edema. Skin: Skin is warm, dry and intact. No rash noted. Neurological: Normal speech and language. No gross focal neurologic deficits. Motor 5/5. Sensation intact.  CN II-XII grossly intact.  No cerebellar signs.  DTR nl. Grip strength equal. Psychiatric: Normal mood and affect. Speech and behavior are normal.   EKG    RADIOLOGY   No results found.  LABS Labs Reviewed  COMPREHENSIVE METABOLIC PANEL - Abnormal; Notable for the following:    BUN/Creatinine Ratio 9.2 (*)    All other components within normal limits  CBC AND DIFFERENTIAL  LIPASE  TICKBORNE ILLNESS PANEL    PROCEDURE   ED COURSE Pertinent labs & imaging results that were available during my care of the patient were reviewed by me (see chart for details).    ATTESTATIONS  _________________  Scribe's Attestation:  Rosina Finder, NP obtained and performed the history, physical exam and medical decision making elements that were entered into the chart. Documentation assistance was provided by me personally, a scribe. Signed by Sarah Zamamiri, Scribe, on November 12, 2013 at 1:29 PM.    A scribe was used when documenting this visit. I agree with the above documentation. Signed by Rosina Finder on November 12, 2013 at 12:44 PM       Rosina Finder, NP 11/12/13 1335

## 2014-01-10 ENCOUNTER — Ambulatory Visit (INDEPENDENT_AMBULATORY_CARE_PROVIDER_SITE_OTHER): Payer: Medicare Other | Admitting: Cardiovascular Disease

## 2014-01-10 VITALS — BP 116/70 | HR 58 | Ht 68.0 in | Wt 172.4 lb

## 2014-01-10 DIAGNOSIS — E785 Hyperlipidemia, unspecified: Secondary | ICD-10-CM

## 2014-01-10 DIAGNOSIS — I2584 Coronary atherosclerosis due to calcified coronary lesion: Secondary | ICD-10-CM

## 2014-01-10 DIAGNOSIS — Z79899 Other long term (current) drug therapy: Secondary | ICD-10-CM

## 2014-01-10 DIAGNOSIS — I10 Essential (primary) hypertension: Secondary | ICD-10-CM

## 2014-01-10 DIAGNOSIS — I2510 Atherosclerotic heart disease of native coronary artery without angina pectoris: Secondary | ICD-10-CM

## 2014-01-10 DIAGNOSIS — I723 Aneurysm of iliac artery: Secondary | ICD-10-CM

## 2014-01-10 LAB — LIPID PANEL
CHOLESTEROL: 143 mg/dL (ref 0–200)
HDL: 39.9 mg/dL (ref >39.00)
LDL Cholesterol: 83 mg/dL (ref 0–99)
NonHDL: 103.1
Total CHOL/HDL Ratio: 4
Triglycerides: 102 mg/dL (ref 0.0–149.0)
VLDL: 20.4 mg/dL (ref 0.0–40.0)

## 2014-01-10 LAB — HEPATIC FUNCTION PANEL
ALT: 24 U/L (ref 0–53)
AST: 24 U/L (ref 0–37)
Albumin: 3.8 g/dL (ref 3.5–5.2)
Alkaline Phosphatase: 56 U/L (ref 39–117)
BILIRUBIN DIRECT: 0.1 mg/dL (ref 0.0–0.3)
Total Bilirubin: 0.8 mg/dL (ref 0.2–1.2)
Total Protein: 6.6 g/dL (ref 6.0–8.3)

## 2014-01-10 NOTE — Assessment & Plan Note (Signed)
Cholesterol is at goal.  Continue current dose of statin and diet Rx.  No myalgias or side effects.  F/U  LFT's in 6 months. Lab Results  Component Value Date   LDLCALC 100* 01/06/2013

## 2014-01-10 NOTE — Progress Notes (Signed)
Patient ID: Kevin Blake, male   DOB: May 23, 1946, 68 y.o.   MRN: [id number] This is a 68 year old gentleman who is here today for a followup visit. He has history of coronary artery disease status post angioplasty and stent placement in 1997. No cardiac events since then. Most recent stress test was in 2009 which showed no evidence of ischemia. He also has history of hypertension and hyperlipidemia. Overall, he has been doing well. He denies any chest pain or dyspnea. Las LDL was 100 in July. He wants repeat blood work today. 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 Sees Dr Marcello Moores in [place] as primary. 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 I sent him for multiple CAT scans in anticipation of operative repair. He is back to discuss these results which will are as follows:  CT angiogram, chest: No thoracic aortic aneurysm  CT anterior, abdomen and pelvis: Stable 3.7 cm abdominal aortic aneurysm. 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  Carotid artery duplex: Less than 40% bilateral internal carotid stenosis  Ankle-brachial indices: 1.1 on the right and 1.0 on the left, both with triphasic waveforms.  Lower extremity duplex: No evidence of popliteal aneurysm.     ROS: Denies fever, malais, weight loss, blurry vision, decreased visual acuity, cough, sputum, SOB, hemoptysis, pleuritic pain, palpitaitons, heartburn, abdominal pain, melena, lower extremity edema, claudication, or rash.  All other systems reviewed and negative  General: Affect appropriate Healthy:  appears stated age 68: normal Neck supple with no adenopathy JVP normal no bruits no thyromegaly Lungs clear with no wheezing and good diaphragmatic motion Heart:  S1/S2 no murmur, no  rub, gallop or click PMI normal Abdomen: benighn, BS positve, no tenderness, no AAA no bruit.  No HSM or HJR Distal pulses intact with no bruits No edema Neuro non-focal Skin warm and dry No muscular weakness   Current Outpatient Prescriptions  Medication Sig Dispense Refill  . aspirin 81 MG tablet Take 81 mg by mouth daily.        . CRESTOR 40 MG tablet TAKE 1 TABLET ONCE DAILY.  30 tablet  10  . fish oil-omega-3 fatty acids 1000 MG capsule Take 2 g by mouth 2 (two) times daily.        Marland Kitchen lisinopril (PRINIVIL,ZESTRIL) 10 MG tablet TAKE 1 TABLET ONCE DAILY.  30 tablet  4  . nitroGLYCERIN (NITROSTAT) 0.4 MG SL tablet Place 1 tablet (0.4 mg total) under the tongue every 5 (five) minutes as needed.  25 tablet  4   No current facility-administered medications for this visit.    Allergies  Lipitor  Electrocardiogram:  SR rate 58  RBBB   Assessment and Plan

## 2014-01-10 NOTE — Patient Instructions (Addendum)
Your physician wants you to follow-up in: YEAR WITH DR NISHAN You will receive a reminder letter in the mail two months in advance. If you don't receive a letter, please call our office to schedule the follow-up appointment.  Your physician recommends that you continue on your current medications as directed. Please refer to the Current Medication list given to you today.  Your physician recommends that you return for lab work in:  TODAY  LIPID LIVER   

## 2014-01-10 NOTE — Addendum Note (Signed)
Addended by: Devra Dopp E on: 01/10/2014 11:56 AM   Modules accepted: Orders

## 2014-01-10 NOTE — Addendum Note (Signed)
Addended by: Avie Echevaria on: 01/10/2014 11:57 AM   Modules accepted: Orders

## 2014-01-10 NOTE — Assessment & Plan Note (Signed)
Stable with no angina and good activity level.  Continue medical Rx  

## 2014-01-10 NOTE — Assessment & Plan Note (Signed)
Well controlled.  Continue current medications and low sodium Dash type diet.    

## 2014-01-10 NOTE — Assessment & Plan Note (Signed)
Exam not impressive  F/U duplex Dr Trula Slade  No pain or symptoms

## 2014-01-22 ENCOUNTER — Other Ambulatory Visit: Payer: Self-pay | Admitting: Cardiovascular Disease

## 2014-03-07 ENCOUNTER — Other Ambulatory Visit: Payer: Self-pay

## 2014-03-07 MED ORDER — ROSUVASTATIN CALCIUM 40 MG PO TABS: ORAL_TABLET | ORAL | Status: DC

## 2014-04-06 ENCOUNTER — Encounter: Payer: Self-pay | Admitting: Surgery

## 2014-04-09 ENCOUNTER — Other Ambulatory Visit (HOSPITAL_COMMUNITY): Payer: Medicare Other

## 2014-04-09 ENCOUNTER — Ambulatory Visit: Payer: Medicare Other | Admitting: Surgery

## 2014-05-02 ENCOUNTER — Encounter: Payer: Self-pay | Admitting: Surgery

## 2014-05-07 ENCOUNTER — Ambulatory Visit (INDEPENDENT_AMBULATORY_CARE_PROVIDER_SITE_OTHER): Payer: Medicare Other | Admitting: Surgery

## 2014-05-07 ENCOUNTER — Encounter: Payer: Self-pay | Admitting: Surgery

## 2014-05-07 ENCOUNTER — Ambulatory Visit (HOSPITAL_COMMUNITY)
Admission: RE | Admit: 2014-05-07 | Discharge: 2014-05-07 | Disposition: A | Discharge status: Home / Self Care | Payer: Medicare Other | Source: Ambulatory Visit | Attending: Surgery | Admitting: Surgery

## 2014-05-07 VITALS — BP 125/76 | HR 61 | Ht 68.0 in | Wt 171.8 lb

## 2014-05-07 DIAGNOSIS — I714 Abdominal aortic aneurysm, without rupture, unspecified: Secondary | ICD-10-CM

## 2014-05-07 DIAGNOSIS — I723 Aneurysm of iliac artery: Secondary | ICD-10-CM | POA: Insufficient documentation

## 2014-05-07 DIAGNOSIS — Z48812 Encounter for surgical aftercare following surgery on the circulatory system: Secondary | ICD-10-CM

## 2014-05-07 NOTE — Addendum Note (Signed)
Addended by: Dorthula Rue L on: 05/07/2014 02:31 PM   Modules accepted: Orders

## 2014-05-07 NOTE — Progress Notes (Signed)
Patient name: Kevin Blake MRN: [id number] DOB: [birth year] Sex: male     Chief Complaint  Patient presents with  . Re-evaluation    1 year f/u AAA    HISTORY OF PRESENT ILLNESS: The patient is back today for follow-up of his aneurysm of his aorta and right common iliac which was initially diagnosed by ultrasound.  He had a CT scan last year which revealed a 3.7 cm infrarenal abdominal aortic aneurysm and a 2.6 cm common iliac aneurysm.  He also had imaging for popliteal aneurysm which was negative.  His carotid Doppler studies were normal.  He has had no complaints since I last saw him.  Past Medical History  Diagnosis Date  . GERD (gastroesophageal reflux disease)   . CAD (coronary artery disease)     s/p angioplasty  . HLD (hyperlipidemia)   . HTN (hypertension)   . Cancer     skin    Past Surgical History  Procedure Laterality Date  . Knee surgery    . Cholecystectomy    . Tendon repair    . Cardiac catheterization  1997  . Coronary angioplasty with stent placement  1997    History   Social History  . Marital Status: Married    Spouse Name: N/A    Number of Children: 2  . Years of Education: N/A   Occupational History  . retired    Social History Main Topics  . Smoking status: Former Smoker -- 1.00 packs/day    Quit date: 06/08/2000  . Smokeless tobacco: Not on file  . Alcohol Use: No  . Drug Use: No  . Sexual Activity: Not on file   Other Topics Concern  . Not on file   Social History Narrative    Family History  Problem Relation Age of Onset  . Cancer    . Cancer Mother   . AAA (abdominal aortic aneurysm) Mother   . Cancer Father   . Heart disease Brother     before age 54  . Hyperlipidemia Brother   . Hypertension Brother   . Heart disease Son     Allergies as of 05/07/2014 - Review Complete 05/07/2014  Allergen Reaction Noted  . Lipitor [atorvastatin calcium]  06/16/2011    Current Outpatient Prescriptions on File Prior to  Visit  Medication Sig Dispense Refill  . aspirin 81 MG tablet Take 81 mg by mouth daily.      . fish oil-omega-3 fatty acids 1000 MG capsule Take 2 g by mouth 2 (two) times daily.      Marland Kitchen lisinopril (PRINIVIL,ZESTRIL) 10 MG tablet TAKE 1 TABLET ONCE DAILY. 30 tablet 5  . nitroGLYCERIN (NITROSTAT) 0.4 MG SL tablet Place 1 tablet (0.4 mg total) under the tongue every 5 (five) minutes as needed. 25 tablet 4  . rosuvastatin (CRESTOR) 40 MG tablet TAKE 1 TABLET ONCE DAILY. 30 tablet 3   No current facility-administered medications on file prior to visit.     REVIEW OF SYSTEMS: Cardiovascular: No chest pain, chest pressure, palpitations, orthopnea, or dyspnea on exertion. No claudication or rest pain,  No history of DVT or phlebitis. Pulmonary: No productive cough, asthma or wheezing. Neurologic: No weakness, paresthesias, aphasia, or amaurosis. No dizziness. Hematologic: No bleeding problems or clotting disorders. Musculoskeletal: No joint pain or joint swelling. Gastrointestinal: No blood in stool or hematemesis Genitourinary: No dysuria or hematuria. Psychiatric:: No history of major depression. Integumentary: No rashes or ulcers. Constitutional: No fever or chills.  PHYSICAL  EXAMINATION:   Vital signs are BP 125/76 mmHg  Pulse 61  Ht 5\' 8"  (1.727 m)  Wt 171 lb 12.8 oz (77.928 kg)  BMI 26.13 kg/m2  SpO2 100% General: The patient appears their stated age. HEENT:  No gross abnormalities Pulmonary:  Non labored breathing Abdomen: Soft and non-tender Musculoskeletal: There are no major deformities. Neurologic: No focal weakness or paresthesias are detected, Skin: There are no ulcer or rashes noted. Psychiatric: The patient has normal affect. Cardiovascular: There is a regular rate and rhythm without significant murmur appreciated.  No carotid bruit   Diagnostic Studies Ultrasound was ordered and reviewed.  This shows a stable 3.3 cm aneurysm in the infrarenal aorta.  And a 2.6 cm  right common iliac artery aneurysm  Assessment: Abdominal aortic and right common iliac aneurysm Plan: After reviewing his ultrasound from today, there has been no change from his CT scan 1 year ago.  Therefore, I have recommended repeat ultrasound in 1 year.  Eldridge Abrahams, M.D. [facility] [place] Office: [phone] Pager:  [phone]

## 2014-06-21 ENCOUNTER — Encounter: Payer: Self-pay | Admitting: Internal Medicine

## 2014-07-16 ENCOUNTER — Other Ambulatory Visit: Payer: Self-pay | Admitting: Cardiovascular Disease

## 2014-08-03 ENCOUNTER — Other Ambulatory Visit: Payer: Self-pay | Admitting: Cardiovascular Disease

## 2014-10-02 ENCOUNTER — Telehealth: Payer: Self-pay | Admitting: Cardiovascular Disease

## 2014-10-02 NOTE — Telephone Encounter (Signed)
Pt c/o medication issue:  1. Name of Medication: Lisinopril  2. How are you currently taking this medication (dosage and times per day)? 10 mg 1x per day  3. Are you having a reaction (difficulty breathing--STAT)? Pt calling stating that his lips and mouth started burning, his face started swelling and he broke out in hives  4. What is your medication issue? Pt calling stating that he did not have any difficulty breathing but he did go to the ER to be on the safe side. Pt calling wanted to make Dr. [name] aware and ask if he can start taking something different.

## 2014-10-02 NOTE — Telephone Encounter (Signed)
SPOKE WITH PT HAS HAD ONLY THE  1  EPISODE WITH  THE  LIPS AND  MOUTH BURNING  ALONG  WITH  FACE  SWELLING  AND  BREAKING  OUT  IN HIVES   HAS  NOTED  FOR LONG PERIOD   GI  ISSUES  WITH  A LOT OF INTESTINAL  PRESSURE  SOMETIMES CAN  BE UNBEARABLE  WILL FORWARD TO  DR [name] FOR  REVIEW  PT  THINKS  DISCOMFORT  MAYBE  COMING FROM CRESTOR AND NOT LISINOPRIL  AS HAS BEEN ON LISINOPRIL FOR YEARS .Adonis Housekeeper

## 2014-10-03 MED ORDER — LOSARTAN POTASSIUM 25 MG PO TABS: 25.0000 mg | ORAL_TABLET | Freq: Every day | ORAL | Status: DC

## 2014-10-03 MED ORDER — ATORVASTATIN CALCIUM 20 MG PO TABS: 20.0000 mg | ORAL_TABLET | Freq: Every day | ORAL | Status: DC

## 2014-10-03 NOTE — Telephone Encounter (Signed)
Stop lisinopril and crestor Start cozaar 25mg  daily and lipitor 20 mg daily

## 2014-10-03 NOTE — Telephone Encounter (Signed)
PT  NOTIFIED ,   PT  THOUGHT HAD  ISSUES WITH LIPITOR IN PAST  BUT  IS WILLING TO TRY  ONCE AGAIN BOTH MEDS  SENT  VIA  EPIC  TO  [place]

## 2014-11-09 ENCOUNTER — Encounter: Payer: Self-pay | Admitting: Cardiovascular Disease

## 2015-01-24 ENCOUNTER — Encounter: Payer: Self-pay | Admitting: Cardiovascular Disease

## 2015-01-24 ENCOUNTER — Ambulatory Visit (INDEPENDENT_AMBULATORY_CARE_PROVIDER_SITE_OTHER): Payer: Medicare Other | Admitting: Cardiovascular Disease

## 2015-01-24 VITALS — BP 134/56 | HR 63 | Ht 68.0 in | Wt 179.0 lb

## 2015-01-24 DIAGNOSIS — I2510 Atherosclerotic heart disease of native coronary artery without angina pectoris: Secondary | ICD-10-CM

## 2015-01-24 NOTE — Patient Instructions (Addendum)
Medication Instructions:  NO CHANGES  Labwork: NONE  Testing/Procedures: Your physician has requested that you have an exercise tolerance test. For further information please visit [email or web address]. Please also follow instruction sheet, as given.   Follow-Up: Your physician wants you to follow-up in: Alexis will receive a reminder letter in the mail two months in advance. If you don't receive a letter, please call our office to schedule the follow-up appointment.  Any Other Special Instructions Will Be Listed Below (If Applicable).

## 2015-01-24 NOTE — Progress Notes (Signed)
Patient ID: Kevin Blake, male   DOB: 1945-09-03, 69 y.o.   MRN: [id number] This is a 69 year old gentleman who is here today for a followup visit. He has history of coronary artery disease status post angioplasty and stent placement in 1997. No cardiac events since then. Most recent stress test was in 2009 which showed no evidence of ischemia. He also has history of hypertension and hyperlipidemia. Overall, he has been doing well. He denies any chest pain or dyspnea. Las LDL was 100 in July. He wants repeat blood work today. 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 Sees Dr Marcello Moores in [place] as primary. 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 I sent him for multiple CAT scans in anticipation of operative repair. He is back to discuss these results which will are as follows:  CT angiogram, chest: No thoracic aortic aneurysm  CT anterior, abdomen and pelvis: Stable 3.7 cm abdominal aortic aneurysm. 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  Carotid artery duplex: Less than 40% bilateral internal carotid stenosis  Ankle-brachial indices: 1.1 on the right and 1.0 on the left, both with triphasic waveforms.  Lower extremity duplex: No evidence of popliteal aneurysm.  Recently lisinopril and crestor changed to cozaar and lipitor due to multiple somatic symptoms including stomach cramping, diaphoresis and hives   ROS: Denies fever, malais, weight loss, blurry vision, decreased visual acuity, cough, sputum, SOB, hemoptysis, pleuritic pain, palpitaitons, heartburn, abdominal pain, melena, lower extremity edema, claudication, or rash.  All other systems reviewed and negative  General: Affect appropriate Healthy:  appears stated age 69: normal Neck supple with  no adenopathy JVP normal no bruits no thyromegaly Lungs clear with no wheezing and good diaphragmatic motion Heart:  S1/S2 no murmur, no rub, gallop or click PMI normal Abdomen: benighn, BS positve, no tenderness, no AAA no bruit.  No HSM or HJR Distal pulses intact with no bruits No edema Neuro non-focal Skin warm and dry No muscular weakness   Current Outpatient Prescriptions  Medication Sig Dispense Refill  . aspirin 81 MG tablet Take 81 mg by mouth daily.      Marland Kitchen atorvastatin (LIPITOR) 20 MG tablet Take 1 tablet (20 mg total) by mouth daily. 30 tablet 11  . fish oil-omega-3 fatty acids 1000 MG capsule Take 2 g by mouth 2 (two) times daily.      Marland Kitchen losartan (COZAAR) 25 MG tablet Take 1 tablet (25 mg total) by mouth daily. 30 tablet 11  . nitroGLYCERIN (NITROSTAT) 0.4 MG SL tablet Place 1 tablet (0.4 mg total) under the tongue every 5 (five) minutes as needed. 25 tablet 4   No current facility-administered medications for this visit.    Allergies  Lipitor  Electrocardiogram:  SR rate 58  RBBB   SR  Rate 63  RBBB   Assessment and Plan CAD: Distant RCA stent f/u ETT   HTN: Well controlled.  Continue current medications and low sodium Dash type diet.   Iliac Aneurysm  Duplex 05/07/14 right iliac 2.6 cm stable  F/u duplex 04/2015 and f/u Dr Trula Slade  Chol:   Cholesterol is at goal.  Continue current dose of statin and diet Rx.  No myalgias or side effects.  F/U  LFT's in 6 months. Lab Results  Component Value Date  [place] 81 01/10/2014

## 2015-02-14 ENCOUNTER — Telehealth (HOSPITAL_COMMUNITY): Payer: Self-pay

## 2015-02-14 NOTE — Telephone Encounter (Signed)
Encounter complete. 

## 2015-02-15 ENCOUNTER — Telehealth (HOSPITAL_COMMUNITY): Payer: Self-pay

## 2015-02-15 NOTE — Telephone Encounter (Signed)
Encounter complete. 

## 2015-02-19 ENCOUNTER — Encounter (HOSPITAL_COMMUNITY): Payer: Self-pay | Admitting: *Deleted

## 2015-02-19 ENCOUNTER — Ambulatory Visit (HOSPITAL_COMMUNITY)
Admission: RE | Admit: 2015-02-19 | Discharge: 2015-02-19 | Disposition: A | Discharge status: Home / Self Care | Payer: Medicare Other | Source: Ambulatory Visit | Attending: Cardiovascular Disease | Admitting: Cardiovascular Disease

## 2015-02-19 DIAGNOSIS — I2510 Atherosclerotic heart disease of native coronary artery without angina pectoris: Secondary | ICD-10-CM | POA: Diagnosis not present

## 2015-02-19 NOTE — Progress Notes (Unsigned)
Dr. Ellyn Hack reviewed ETT and gave the okay to discharge patient home.

## 2015-02-20 LAB — EXERCISE TOLERANCE TEST
CHL CUP RESTING HR STRESS: 60 {beats}/min
CHL CUP STRESS STAGE 2 SPEED: 0.6 mph
CHL CUP STRESS STAGE 3 SPEED: 1 mph
CHL CUP STRESS STAGE 4 DBP: 99 mmHg
CHL CUP STRESS STAGE 4 GRADE: 10 %
CHL CUP STRESS STAGE 5 DBP: 117 mmHg
CHL CUP STRESS STAGE 5 SBP: 185 mmHg
CHL CUP STRESS STAGE 8 DBP: 88 mmHg
CHL CUP STRESS STAGE 8 HR: 130 {beats}/min
CHL CUP STRESS STAGE 9 HR: 85 {beats}/min
CHL RATE OF PERCEIVED EXERTION: 16
CSEPED: 9 min
CSEPPHR: 150 {beats}/min
CSEPPMHR: 100 %
Estimated workload: 10.8 METS
Exercise duration (sec): 25 s
MPHR: 150 {beats}/min
Percent HR: 100 %
Stage 1 DBP: 96 mmHg
Stage 1 Grade: 0 %
Stage 1 HR: 71 {beats}/min
Stage 1 SBP: 131 mmHg
Stage 1 Speed: 0 mph
Stage 2 Grade: 0 %
Stage 2 HR: 71 {beats}/min
Stage 3 Grade: 0 %
Stage 3 HR: 71 {beats}/min
Stage 4 HR: 100 {beats}/min
Stage 4 SBP: 191 mmHg
Stage 4 Speed: 1.7 mph
Stage 5 Grade: 12 %
Stage 5 HR: 113 {beats}/min
Stage 5 Speed: 2.5 mph
Stage 6 DBP: 106 mmHg
Stage 6 Grade: 14 %
Stage 6 HR: 139 {beats}/min
Stage 6 SBP: 189 mmHg
Stage 6 Speed: 3.4 mph
Stage 7 Grade: 16 %
Stage 7 HR: 150 {beats}/min
Stage 7 Speed: 4.3 mph
Stage 8 Grade: 0 %
Stage 8 SBP: 185 mmHg
Stage 8 Speed: 0 mph
Stage 9 DBP: 83 mmHg
Stage 9 Grade: 0 %
Stage 9 SBP: 138 mmHg
Stage 9 Speed: 0 mph

## 2015-04-15 ENCOUNTER — Encounter: Payer: Self-pay | Admitting: Family Medicine

## 2015-05-08 ENCOUNTER — Encounter: Payer: Self-pay | Admitting: Surgery

## 2015-05-13 ENCOUNTER — Ambulatory Visit (HOSPITAL_COMMUNITY)
Admission: RE | Admit: 2015-05-13 | Discharge: 2015-05-13 | Disposition: A | Discharge status: Home / Self Care | Payer: Medicare Other | Source: Ambulatory Visit | Attending: Surgery | Admitting: Surgery

## 2015-05-13 ENCOUNTER — Ambulatory Visit (INDEPENDENT_AMBULATORY_CARE_PROVIDER_SITE_OTHER): Payer: Self-pay | Admitting: Surgery

## 2015-05-13 ENCOUNTER — Other Ambulatory Visit: Payer: Self-pay

## 2015-05-13 DIAGNOSIS — I714 Abdominal aortic aneurysm, without rupture, unspecified: Secondary | ICD-10-CM

## 2015-05-13 DIAGNOSIS — Z48812 Encounter for surgical aftercare following surgery on the circulatory system: Secondary | ICD-10-CM

## 2015-05-13 DIAGNOSIS — I723 Aneurysm of iliac artery: Secondary | ICD-10-CM | POA: Insufficient documentation

## 2015-05-14 ENCOUNTER — Other Ambulatory Visit: Payer: Self-pay

## 2015-05-14 ENCOUNTER — Encounter: Payer: Self-pay | Admitting: Gastroenterology

## 2015-05-14 DIAGNOSIS — I714 Abdominal aortic aneurysm, without rupture, unspecified: Secondary | ICD-10-CM

## 2015-05-14 NOTE — Patient Instructions (Addendum)
Dear Kevin Blake, Your recent Vascular Lab visit on May 13, 2015 indicates: Slight INCREASE size of both Aneurysms.  Please return in ONE year.                       Abdominal Aortic Aneurysm An aneurysm is a weakened or damaged part of an artery wall that bulges from the normal force of blood pumping through the body. An abdominal aortic aneurysm is an aneurysm that occurs in the lower part of the aorta, the main artery of the body.  The major concern with an abdominal aortic aneurysm is that it can enlarge and burst (rupture) or blood can flow between the layers of the wall of the aorta through a tear (aorticdissection). Both of these conditions can cause bleeding inside the body and can be life threatening unless diagnosed and treated promptly. CAUSES  The exact cause of an abdominal aortic aneurysm is unknown. Some contributing factors are:   A hardening of the arteries caused by the buildup of fat and other substances in the lining of a blood vessel (arteriosclerosis).  Inflammation of the walls of an artery (arteritis).   Connective tissue diseases, such as Marfan syndrome.   Abdominal trauma.   An infection, such as syphilis or staphylococcus, in the wall of the aorta (infectious aortitis) caused by bacteria. RISK FACTORS  Risk factors that contribute to an abdominal aortic aneurysm may include:  Age older than 59 years.   High blood pressure (hypertension).  Male gender.  Ethnicity (white race).  Obesity.  Family history of aneurysm (first degree relatives only).  Tobacco use. PREVENTION  The following healthy lifestyle habits may help decrease your risk of abdominal aortic aneurysm:  Quitting smoking. Smoking can raise your blood pressure and cause arteriosclerosis.  Limiting or avoiding alcohol.  Keeping your blood pressure, blood sugar level, and cholesterol levels within normal limits.  Decreasing your salt intake. In  somepeople, too much salt can raise blood pressure and increase your risk of abdominal aortic aneurysm.  Eating a diet low in saturated fats and cholesterol.  Increasing your fiber intake by including whole grains, vegetables, and fruits in your diet. Eating these foods may help lower blood pressure.  Maintaining a healthy weight.  Staying physically active and exercising regularly. SYMPTOMS  The symptoms of abdominal aortic aneurysm may vary depending on the size and rate of growth of the aneurysm.Most grow slowly and do not have any symptoms. When symptoms do occur, they may include:  Pain (abdomen, side, lower back, or groin). The pain may vary in intensity. A sudden onset of severe pain may indicate that the aneurysm has ruptured.  Feeling full after eating only small amounts of food.  Nausea or vomiting or both.  Feeling a pulsating lump in the abdomen.  Feeling faint or passing out. DIAGNOSIS  Since most unruptured abdominal aortic aneurysms have no symptoms, they are often discovered during diagnostic exams for other conditions. An aneurysm may be found during the following procedures:  Ultrasonography (A one-time screening for abdominal aortic aneurysm by ultrasonography is also recommended for all men aged 67-75 years who have ever smoked).  X-ray exams.  A computed tomography (CT).  Magnetic resonance imaging (MRI).  Angiography or arteriography. TREATMENT  Treatment of an abdominal aortic aneurysm depends on the size of your aneurysm, your age, and risk factors for rupture. Medication to control blood pressure and pain may be used to manage aneurysms smaller than 6 cm. Regular  monitoring for enlargement may be recommended by your caregiver if:  The aneurysm is 3-4 cm in size (an annual ultrasonography may be recommended).  The aneurysm is 4-4.5 cm in size (an ultrasonography every 6 months may be recommended).  The aneurysm is larger than 4.5 cm in size (your  caregiver may ask that you be examined by a vascular surgeon). If your aneurysm is larger than 6 cm, surgical repair may be recommended. There are two main methods for repair of an aneurysm:   Endovascular repair (a minimally invasive surgery). This is done most often.  Open repair. This method is used if an endovascular repair is not possible.   This information is not intended to replace advice given to you by your health care provider. Make sure you discuss any questions you have with your health care provider.   Document Released: 03/04/2005 Document Revised: 09/19/2012 Document Reviewed: 06/24/2012 Elsevier Interactive Patient Education 2016 [employer] Abdominal Aortic Aneurysm An aneurysm is a weakened or damaged part of an artery wall that bulges from the normal force of blood pumping through the body. An abdominal aortic aneurysm is an aneurysm that occurs in the lower part of the aorta, the main artery of the body.  The major concern with an abdominal aortic aneurysm is that it can enlarge and burst (rupture) or blood can flow between the layers of the wall of the aorta through a tear (aorticdissection). Both of these conditions can cause bleeding inside the body and can be life threatening unless diagnosed and treated promptly. CAUSES  The exact cause of an abdominal aortic aneurysm is unknown. Some contributing factors are:   A hardening of the arteries caused by the buildup of fat and other substances in the lining of a blood vessel (arteriosclerosis).  Inflammation of the walls of an artery (arteritis).   Connective tissue diseases, such as Marfan syndrome.   Abdominal trauma.   An infection, such as syphilis or staphylococcus, in the wall of the aorta (infectious aortitis) caused by bacteria. RISK FACTORS  Risk factors that contribute to an abdominal aortic aneurysm may include:  Age older than 35 years.   High blood pressure (hypertension).  Male  gender.  Ethnicity (white race).  Obesity.  Family history of aneurysm (first degree relatives only).  Tobacco use. PREVENTION  The following healthy lifestyle habits may help decrease your risk of abdominal aortic aneurysm:  Quitting smoking. Smoking can raise your blood pressure and cause arteriosclerosis.  Limiting or avoiding alcohol.  Keeping your blood pressure, blood sugar level, and cholesterol levels within normal limits.  Decreasing your salt intake. In somepeople, too much salt can raise blood pressure and increase your risk of abdominal aortic aneurysm.  Eating a diet low in saturated fats and cholesterol.  Increasing your fiber intake by including whole grains, vegetables, and fruits in your diet. Eating these foods may help lower blood pressure.  Maintaining a healthy weight.  Staying physically active and exercising regularly. SYMPTOMS  The symptoms of abdominal aortic aneurysm may vary depending on the size and rate of growth of the aneurysm.Most grow slowly and do not have any symptoms. When symptoms do occur, they may include:  Pain (abdomen, side, lower back, or groin). The pain may vary in intensity. A sudden onset of severe pain may indicate that the aneurysm has ruptured.  Feeling full after eating only small amounts of food.  Nausea or vomiting or both.  Feeling a pulsating lump in the abdomen.  Feeling faint or  passing out. DIAGNOSIS  Since most unruptured abdominal aortic aneurysms have no symptoms, they are often discovered during diagnostic exams for other conditions. An aneurysm may be found during the following procedures:  Ultrasonography (A one-time screening for abdominal aortic aneurysm by ultrasonography is also recommended for all men aged 31-75 years who have ever smoked).  X-ray exams.  A computed tomography (CT).  Magnetic resonance imaging (MRI).  Angiography or arteriography. TREATMENT  Treatment of an abdominal aortic  aneurysm depends on the size of your aneurysm, your age, and risk factors for rupture. Medication to control blood pressure and pain may be used to manage aneurysms smaller than 6 cm. Regular monitoring for enlargement may be recommended by your caregiver if:  The aneurysm is 3-4 cm in size (an annual ultrasonography may be recommended).  The aneurysm is 4-4.5 cm in size (an ultrasonography every 6 months may be recommended).  The aneurysm is larger than 4.5 cm in size (your caregiver may ask that you be examined by a vascular surgeon). If your aneurysm is larger than 6 cm, surgical repair may be recommended. There are two main methods for repair of an aneurysm:   Endovascular repair (a minimally invasive surgery). This is done most often.  Open repair. This method is used if an endovascular repair is not possible.   This information is not intended to replace advice given to you by your health care provider. Make sure you discuss any questions you have with your health care provider.   Document Released: 03/04/2005 Document Revised: 09/19/2012 Document Reviewed: 06/24/2012 Elsevier Interactive Patient Education [employer].

## 2015-05-14 NOTE — Patient Instructions (Signed)
Dear Mr. Merleen Nicely,                                                                                                                  Your Recent Vascular Lab visit of May 13, 2015 indicates:                                                                                                                                  Slight increase in size of both aneurysms.  Please follow up in one year.                                                                              Blood pumps away from the heart through tubes (blood vessels) called arteries. Aneurysms are weak or damaged places in the wall of an artery. It bulges out like a balloon. An abdominal aortic aneurysm happens in the main artery of the body (aorta). It can burst or tear, causing bleeding inside the body. This is an emergency. It needs treatment right away. CAUSES  The exact cause is unknown. Things that could cause this problem include:  Fat and other substances building up in the lining of a tube.  Swelling of the walls of a blood vessel.  Certain tissue diseases.  Belly (abdominal) trauma.  An infection in the main artery of the body. RISK FACTORS There are things that make it more likely for you to have an aneurysm. These include:  Being over the age of 71 years old.  Having high blood pressure (hypertension).  Being a male.  Being white.  Being very overweight  (obese).  Having a family history of aneurysm.  Using tobacco products. PREVENTION To lessen your chance of getting this condition:  Stop smoking. Stop chewing tobacco.  Limit or avoid alcohol.  Keep your blood pressure, blood sugar, and cholesterol within normal limits.  Eat less salt.  Eat foods low in saturated fats and cholesterol. These are found in animal and whole dairy products.  Eat more fiber. Fiber is found in whole grains, vegetables, and fruits.  Keep a healthy weight.  Stay active and exercise often. SYMPTOMS Symptoms depend on the size of the aneurysm and how fast it grows. There may not be symptoms. If symptoms occur, they can include:  Pain (belly, side, lower back, or groin).  Feeling full after eating a small amount of food.  Feeling sick to your stomach (nauseous), throwing up (vomiting), or both.  Feeling a lump in your belly that feels like it is beating (pulsating).  Feeling like you will pass out (faint). TREATMENT   Medicine to control blood pressure and pain.  Imaging tests to see if the aneurysm gets bigger.  Surgery. MAKE SURE YOU:   Understand these instructions.  Will watch your condition.  Will get help right away if you are not doing well or get worse.   This information is not intended to replace advice given to you by your health care provider. Make sure you discuss any questions you have with your health care provider.   Document Released: 09/19/2012 Document Reviewed: 09/19/2012 Elsevier Interactive Patient Education [employer].

## 2015-06-04 NOTE — Progress Notes (Signed)
cancelled

## 2015-07-18 ENCOUNTER — Encounter: Payer: Self-pay | Admitting: Gastroenterology

## 2015-07-18 ENCOUNTER — Ambulatory Visit (INDEPENDENT_AMBULATORY_CARE_PROVIDER_SITE_OTHER): Payer: Medicare Other | Admitting: Gastroenterology

## 2015-07-18 VITALS — BP 164/90 | HR 64 | Ht 66.5 in | Wt 179.2 lb

## 2015-07-18 DIAGNOSIS — G8929 Other chronic pain: Secondary | ICD-10-CM | POA: Diagnosis not present

## 2015-07-18 DIAGNOSIS — R140 Abdominal distension (gaseous): Secondary | ICD-10-CM

## 2015-07-18 DIAGNOSIS — R1010 Upper abdominal pain, unspecified: Secondary | ICD-10-CM

## 2015-07-18 DIAGNOSIS — R1011 Right upper quadrant pain: Principal | ICD-10-CM

## 2015-07-18 MED ORDER — NORTRIPTYLINE HCL 10 MG PO CAPS: ORAL_CAPSULE | ORAL | Status: DC

## 2015-07-18 NOTE — Progress Notes (Signed)
HPI :  70 y/o male with a history of CAD here for a new patient visit for multiple abdominal complaints. He is here with his wife this morning. His main complaints are chronic abdominal pain and "bloating" / distension.   He reports ongoing pain in the RUQ. He thinks it has been the worst in the past 2 weeks, but it has been bothering him for more than 25 years from what the wife reports. His wife thinks he has been complaining of this as long as she can remember and has had an extensive evaluation for it. He thinks the pain is there all the time 24/7, but severity fluctuates. He reports pain is rated 2-7/10, and flucuates. Pain is present 24/7. He thinks peptobismol may help it. He thinks eating can make it worse, although unsure if it causes the pain to be worse or the bloating when he eats is causing him discomfort. He thinks within 30 minutes after eating he will feel bloated and in discomfort. Pain can linger at a severe level then for a day at a time. Pain is worse in the morning. He reports the bloating will last until he belches which can relief his pain. He has some nausea but does not vomit. He does endorse some early satiety. He denies weight loss. He does not think his PO intake changes due to these symptoms but can skip meals. He thinks stress can make his symptoms worse. He usually takes alkaseltzer or pepto bismol for his symptoms. He denies any routine reflux symptoms. No pyrosis. No dysphagia routinely.   He reports for his RUQ pain he had his gallbladder removed in the 1990s which did not help his symptoms at all. He denies any trauma to his abdomen in this area. He also had an exploratory laporotomy for this pain for which no pathology was noted. He endorses prior imaging and labs which have failed to show any pathology to account for his pain. He reportedly was seen at [facility] and received a trial of a "TCA" for what was thought to be functional pain. He thinks Nortriptyline is what he was  given remotely, and that it may have provided relief in the 1990s but he stopped it for unclear reasons. He thought he had tolerated it well when he was on it. Overall, he thinks the bloating / pain is his main complaint.  He has been told he has gluten intolerance but has had negative celiac testing. 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 He has tried lactaid which he thinks has helped.   He denies any changes in his bowel habits. He takes generic metamucil every day. He has 3 BMs per day, at regular intervals. If he does not have metamucil he can have some abdominal pain that is relieved with a BM. He denies diarrhea or constipation on metamucil, he can have some loose stools if he doesn't take it. He denies any blood in the stools.   He has had a colonoscopy within 2-3 years, reportedly normal. It was done by Dr. Lyndel Safe at [facility]. He has had a prior EGD done for these complaints, he is not sure when it was done, he thinks between 5-10 years. He denies routine NSAID use. He has a history of AAA and has been monitored with CT and [place] over the years.  [facility] endoscopic history: Colonoscopy 2000 - diverticulosis, no colitis noted Colonoscopy 1995 - mild colitis of sigmoid colon thought to be due to self limited infection  Past Medical History  Diagnosis Date  . GERD (gastroesophageal reflux disease)   . CAD (coronary artery disease)     s/p angioplasty  . HLD (hyperlipidemia)   . HTN (hypertension)   . Skin cancer   . Arthritis   . Asthma   . Colon polyps   . Diverticulosis   . IBS (irritable bowel syndrome)   . Chronic abdominal pain     RUQ  . AAA (abdominal aortic aneurysm) [facility])      Past Surgical History  Procedure Laterality Date  . Knee surgery    . Cholecystectomy    . Tendon repair    . Cardiac catheterization  1997  . Coronary angioplasty with stent placement  1997  . Exploratory laparotomy     Family History  Problem Relation Age of Onset  . Heart disease  Mother   . Breast cancer Mother   . AAA (abdominal aortic aneurysm) Mother   . Heart disease Brother     before age 10  . Hyperlipidemia Brother   . Hypertension Brother   . Heart disease Son   . Liver disease Brother   . Kidney cancer Father   . Kidney disease Brother    Social History  Substance Use Topics  . Smoking status: Former Smoker -- 1.00 packs/day    Quit date: 06/08/2000  . Smokeless tobacco: Never Used  . Alcohol Use: No   Current Outpatient Prescriptions  Medication Sig Dispense Refill  . aspirin 81 MG tablet Take 81 mg by mouth daily.      Marland Kitchen atorvastatin (LIPITOR) 40 MG tablet Take 1 tablet by mouth daily.    . fish oil-omega-3 fatty acids 1000 MG capsule Take 2 g by mouth 2 (two) times daily.      . fluticasone (FLONASE) 50 MCG/ACT nasal spray Place 1 spray into both nostrils as needed.    Marland Kitchen losartan (COZAAR) 50 MG tablet Take 1 tablet by mouth daily.    . nitroGLYCERIN (NITROSTAT) 0.4 MG SL tablet Place 1 tablet (0.4 mg total) under the tongue every 5 (five) minutes as needed. 25 tablet 4  . nortriptyline (PAMELOR) 10 MG capsule Take 1 capsule for for 2 weeks and then increase to 2 capsules every night. 90 capsule 3   No current facility-administered medications for this visit.   No Known Allergies   Review of Systems: All systems reviewed and negative except where noted in HPI.   No results found for: WBC, HGB, HCT, MCV, PLT  No results found for: CREATININE, BUN, NA, K, CL, CO2  Lab Results  Component Value Date   ALT 24 01/10/2014   AST 24 01/10/2014   ALKPHOS 56 01/10/2014   BILITOT 0.8 01/10/2014   Prior imaging reviewed in Epic  Physical Exam: BP 164/90 mmHg  Pulse 64  Ht 5' 6.5" (1.689 m)  Wt 179 lb 4 oz (81.307 kg)  BMI 28.50 kg/m2 Constitutional: Pleasant,well-developed, male in no acute distress. HEENT: Normocephalic and atraumatic. Conjunctivae are normal. No scleral icterus. Neck supple.  Cardiovascular: Normal rate, regular  rhythm.  Pulmonary/chest: Effort normal and breath sounds normal. No wheezing, rales or rhonchi. Abdominal: Soft, nondistended, RUQ TTP under costal margin, with positive Carnett sign that reproduce symptoms. Periumbilical hernia also noted. Bowel sounds active throughout. There are no masses palpable. No hepatomegaly. Extremities: no edema Lymphadenopathy: No cervical adenopathy noted. Neurological: Alert and oriented to person place and time. Skin: Skin is warm and dry. No rashes noted. Psychiatric: Normal mood and  affect. Behavior is normal.   ASSESSMENT AND PLAN: 70 y/o male with PMH as outlined above presenting for evaluation for chronic abdominal pain and bloating.   He and wife report symptoms that have been stable more than 25 years. He endorses an extensive prior workup for these symptoms to include labs, imaging, endoscopy, cholecystectomy, and exploratory laporotomy which have failed to yield a diagnosis. He has been treated for functional pain with TCA in the past which has provided some relief. I reassured the patient given his extensive workup to date, this does not appear to represent a malignancy or life threatening problem. I think his pain is probably multifactorial. He has a focal area on exam I can reproduce his symptoms with palpation and with a (+) Carnett sign, so I do think he has a component of abdominal wall pain. For this I am recommending he go back on Nortriptyline which he had benefit from previously. Will start at 10mg  qHS and then titrate up to 20mg  qHS. I counseled him on side effects and they wished to proceed. He can also try some topical capcaisin cream to see if this helps as well. If his pain persists despite these measures I would recommend a pain management consult for consideration for trigger point injection / nerve block.   Otherwise, he may otherwise have functional dyspepsia and has significant postprandial bloating. We will see if TCA helps with this. I  would also recommend he stop metamucil which can cause significant bloating, and see if this helps. Further, I counseled him on a low FODMOP diet to see if this will help reduce bloating. If his bloating does not improve, we will consider a trial of Rifaximin and consider gastric emptying study. He agreed.   In the interim, I will obtain old records of prior EGD and colonoscopy he had from another provider to clarify the results and when he is next due for colonoscopy. He has had a prior workup for celiac he reports was negative but will see if we can obtain these records as well. He agreed. All questions answered.   Travis Cellar, MD [facility] Pager [phone]  CC: Charletta Cousin, MD

## 2015-07-18 NOTE — Patient Instructions (Signed)
Discontinue Metamucil and begin taking Citrucel daily.   Please start the Witmer that was given to you in the clinic today.   We have sent medications to your pharmacy for you to pick up at your convenience.

## 2015-07-19 ENCOUNTER — Telehealth: Payer: Self-pay | Admitting: Gastroenterology

## 2015-07-19 NOTE — Telephone Encounter (Signed)
Received records from Dr. Steve Rattler prior workup for this patient:  Colonoscopy 10/04/2013 - 56mm adenoma, diverticulosis in the sigmoid colon Reported negative capsule endoscopy Negative CT x 2 History of diverticulitis Negative ex-lap Negative celiac workup  Will await course as outlined in clinic note. Rollene Fare, based on one small adenoma in 2015, patient is due for a recall colonoscopy in 2020. thanks

## 2015-07-22 NOTE — Telephone Encounter (Signed)
Patient's wife notified of recommendation. Recall in EPIC.

## 2015-07-26 ENCOUNTER — Telehealth: Payer: Self-pay | Admitting: Gastroenterology

## 2015-07-29 NOTE — Telephone Encounter (Signed)
Pt states he thinks the medication needs a prior auth. No info received from the pts pharmacy. [facility]. They indicated they faxed a prior auth notification. This was sent to an unknown fax. Correct fax info given to [place].

## 2015-07-29 NOTE — Telephone Encounter (Signed)
Awaiting prior auth from [facility]. Faxed request over today.

## 2015-07-31 NOTE — Telephone Encounter (Signed)
[employer] has approved patients Nortriptyline 10 mg through 06/07/16. [id number].

## 2015-08-04 NOTE — Progress Notes (Signed)
This is a 70 y.o.  gentleman who is here today for a followup visit. He has history of coronary artery disease status post angioplasty and stent placement in 1997. No cardiac events since then. Most recent stress test was in 2009 which showed no evidence of ischemia. He also has history of hypertension and hyperlipidemia. Overall, he has been doing well. He denies any chest pain or dyspnea. Las LDL was 100 in July. He wants repeat blood work today. 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 Sees Dr Marcello Moores in [place] as primary. 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  Lower extremity duplex: No evidence of popliteal aneurysm.  Lisinopril and crestor changed to cozaar and lipitor due to multiple somatic symptoms including stomach cramping, diaphoresis and hives  Has chronic abdominal pain Etiology not clear has seen GI ? IBS  ROS: Denies fever, malais, weight loss, blurry vision, decreased visual acuity, cough, sputum, SOB, hemoptysis, pleuritic pain, palpitaitons, heartburn, abdominal pain, melena, lower extremity edema, claudication, or rash.  All other systems reviewed and negative  General: Affect appropriate Healthy:  appears stated age 13: normal Neck supple with no adenopathy JVP normal no bruits no thyromegaly Lungs clear with no wheezing and good diaphragmatic motion Heart:  S1/S2 no murmur, no rub, gallop or click PMI normal Abdomen: benighn, BS positve, no tenderness, no AAA no bruit.  No HSM or HJR Distal pulses intact with no bruits No edema Neuro non-focal Skin warm and dry No muscular weakness   Current Outpatient Prescriptions   Medication Sig Dispense Refill  . nortriptyline (PAMELOR) 10 MG capsule Take 20 mg by mouth at bedtime.    Marland Kitchen aspirin 81 MG tablet Take 81 mg by mouth daily.      Marland Kitchen atorvastatin (LIPITOR) 40 MG tablet Take 1 tablet (40 mg total) by mouth daily. 90 tablet 3  . fish oil-omega-3 fatty acids 1000 MG capsule Take 2 g by mouth 2 (two) times daily.      . fluticasone (FLONASE) 50 MCG/ACT nasal spray Place 1 spray into both nostrils daily as needed for allergies or rhinitis.     Marland Kitchen losartan (COZAAR) 50 MG tablet Take 1 tablet (50 mg total) by mouth daily. 90 tablet 3  . nitroGLYCERIN (NITROSTAT) 0.4 MG SL tablet Place 1 tablet (0.4 mg total) under the tongue every 5 (five) minutes as needed. 25 tablet 4   No current facility-administered medications for this visit.    Allergies  Review of patient's allergies indicates no known allergies.  Electrocardiogram:  01/10/14  SR rate 58  RBBB   01/2015  SR  Rate 63  RBBB   Assessment and Plan CAD: Distant RCA stent f/u ETT   02/19/15 was normal stable observe  HTN: Well controlled.  Continue current medications and low sodium Dash type diet.   Iliac Aneurysm  Duplex 05/2015  right iliac 2.8 cm  AAA 3.5 cm stable  f/u Dr Trula Slade  Chol:   Cholesterol is at goal.  Continue current dose of statin and diet Rx.  No myalgias or side effects.  F/U  LFT's in 6 months. Lab Results  Component Value Date   LDLCALC 83  01/10/2014   IBS:  F/u GI no pathology identified in past          Jenkins Rouge

## 2015-08-07 ENCOUNTER — Encounter: Payer: Self-pay | Admitting: Cardiovascular Disease

## 2015-08-07 ENCOUNTER — Ambulatory Visit (INDEPENDENT_AMBULATORY_CARE_PROVIDER_SITE_OTHER): Payer: Medicare Other | Admitting: Cardiovascular Disease

## 2015-08-07 VITALS — BP 140/80 | HR 56 | Ht 66.0 in | Wt 177.8 lb

## 2015-08-07 DIAGNOSIS — I2510 Atherosclerotic heart disease of native coronary artery without angina pectoris: Secondary | ICD-10-CM | POA: Diagnosis not present

## 2015-08-07 DIAGNOSIS — I2584 Coronary atherosclerosis due to calcified coronary lesion: Secondary | ICD-10-CM | POA: Diagnosis not present

## 2015-08-07 MED ORDER — LOSARTAN POTASSIUM 50 MG PO TABS: 50.0000 mg | ORAL_TABLET | Freq: Every day | ORAL | Status: DC

## 2015-08-07 MED ORDER — ATORVASTATIN CALCIUM 40 MG PO TABS: 40.0000 mg | ORAL_TABLET | Freq: Every day | ORAL | Status: DC

## 2015-08-07 NOTE — Patient Instructions (Addendum)

## 2015-10-07 ENCOUNTER — Telehealth: Payer: Self-pay | Admitting: Gastroenterology

## 2015-10-07 NOTE — Telephone Encounter (Signed)
Patient states he cannot tolerate the Nortriptyline. States he has only been able to take 10 mg because is causes constipation.  States he wakes up cramping and hurting after taking.  States he does not have relief until after 3 bowel movements.Reports he is still having pain under ribs that comes and goes also.

## 2015-10-07 NOTE — Telephone Encounter (Signed)
Left a message for patient to call back. 

## 2015-10-08 NOTE — Telephone Encounter (Signed)
Okay, thanks for clarifying. I reviewed his prior notes and he did endorse some bloating during that visit as well. I had previously recommended a low FODMOP diet for him, if he has not tried this yet he should try it and see if it helps his bloating. For his bloating symptoms otherwise he can try VSL#3- two to four capsules daily, or we can try rifaximin 550mg  TID for 2 weeks, whichever is cheaper, if you want to offer this to him. thanks

## 2015-10-08 NOTE — Telephone Encounter (Signed)
He has abdominal pain and also has gas. He goes back and forth describing the symptoms. He seems to be saying the constipation occurs with the medication and increases his pain. He states he has to keep going to the bathroom about 3 times before he has relief. He is also having gas that is uncomfortable also.

## 2015-10-08 NOTE — Telephone Encounter (Signed)
Patient given recommendations. He has not tried the cream but will. He states "i just don't know if this is going to help my gas."  He will call back after trying this.

## 2015-10-08 NOTE — Telephone Encounter (Signed)
Left a message for patient to call back. 

## 2015-10-08 NOTE — Telephone Encounter (Signed)
Please clarify the cream is not going to help gas nor treat that issue, it is only for his pain, which I suspect is musculoskeletal. If bloating / gas is another problem he can try a low FODMOP diet, trial of bentyl, or see me in clinic for reassessment. Thanks

## 2015-10-08 NOTE — Telephone Encounter (Signed)
Okay he can stop the Nortriptyline then. If pain is the main issue can you ask him if he has tried topical capsaicin cream? This is over the counter and he can try this to use a few times daily. If he has tried and no benefit, we may consider a pain management consultation for trigger point injection / nerve block. Can you let him know? He has had an extensive evaluation for these symptoms which have been present > 20 years, I think his pain musculoskeletal / functional

## 2015-10-09 NOTE — Telephone Encounter (Signed)
Left a message for patient to call back. 

## 2015-10-10 NOTE — Telephone Encounter (Signed)
Left a message for patient to call back. 

## 2015-10-15 NOTE — Telephone Encounter (Signed)
Left a message for patient to call back. 

## 2015-10-16 ENCOUNTER — Encounter: Payer: Self-pay | Admitting: *Deleted

## 2015-10-16 ENCOUNTER — Telehealth: Payer: Self-pay | Admitting: *Deleted

## 2015-10-16 MED ORDER — RIFAXIMIN 550 MG PO TABS: ORAL_TABLET | ORAL | Status: DC

## 2015-10-16 NOTE — Telephone Encounter (Signed)
Mailed a letter to patient requesting he call us to discuss treatment options if symptoms are continuing.

## 2015-10-16 NOTE — Telephone Encounter (Signed)
Spoke with patient and gave him Dr. Doyne Keel recommendations for bloating. Patient has tried the [employer]. He wants to try Rifaximin. Rx sent to his pharmacy per his request. He will let us know if it is too expensive.

## 2015-11-22 ENCOUNTER — Encounter: Payer: Self-pay | Admitting: Cardiovascular Disease

## 2016-01-28 ENCOUNTER — Telehealth: Payer: Self-pay | Admitting: Cardiovascular Disease

## 2016-01-28 DIAGNOSIS — Z79899 Other long term (current) drug therapy: Secondary | ICD-10-CM

## 2016-01-28 NOTE — Telephone Encounter (Signed)
Left message for patient to call back. Will forward to Dr. [name] for advisement.

## 2016-01-28 NOTE — Telephone Encounter (Signed)
°  New Prob   Pt is requesting to try something other than Lipitor medication. States he has been experiencing muscle and joint pain x 3-4 months which he attributes to medication. Please call.

## 2016-01-29 MED ORDER — ROSUVASTATIN CALCIUM 20 MG PO TABS: 20.0000 mg | ORAL_TABLET | ORAL | 6 refills | Status: DC

## 2016-01-29 NOTE — Telephone Encounter (Signed)
Try crestor M/W/F call in script for daily though F/u lipid and liver in 3 months after change

## 2016-01-29 NOTE — Telephone Encounter (Signed)
Called patient about. Per Dr. [name], try crestor 20 mg by mouth M/W/F. Patient will need follow-up lipid and liver panel in 3 months after change. Patient is going to see if his PCP can have lab work done at his office. Patient will call our office and let us know. Will make patient an appointment just in case. Patient verbalized understanding.

## 2016-01-29 NOTE — Telephone Encounter (Signed)
Follow Up:; ° ° °Returning your call. °

## 2016-05-07 ENCOUNTER — Other Ambulatory Visit: Payer: Medicare Other | Admitting: *Deleted

## 2016-05-07 DIAGNOSIS — Z79899 Other long term (current) drug therapy: Secondary | ICD-10-CM

## 2016-05-07 LAB — LIPID PANEL
Cholesterol: 177 mg/dL (ref <200)
HDL: 45 mg/dL (ref >40)
LDL CALC: 117 mg/dL — AB (ref <100)
Total CHOL/HDL Ratio: 3.9 Ratio (ref <5.0)
Triglycerides: 74 mg/dL (ref <150)
VLDL: 15 mg/dL (ref <30)

## 2016-05-07 LAB — HEPATIC FUNCTION PANEL
ALBUMIN: 4.3 g/dL (ref 3.6–5.1)
ALK PHOS: 59 U/L (ref 40–115)
ALT: 21 U/L (ref 9–46)
AST: 25 U/L (ref 10–35)
BILIRUBIN DIRECT: 0.2 mg/dL (ref <=0.2)
BILIRUBIN TOTAL: 1 mg/dL (ref 0.2–1.2)
Indirect Bilirubin: 0.8 mg/dL (ref 0.2–1.2)
Total Protein: 6.3 g/dL (ref 6.1–8.1)

## 2016-05-19 ENCOUNTER — Encounter: Payer: Self-pay | Admitting: Family

## 2016-05-22 ENCOUNTER — Telehealth: Payer: Self-pay | Admitting: Cardiovascular Disease

## 2016-05-22 DIAGNOSIS — Z79899 Other long term (current) drug therapy: Secondary | ICD-10-CM

## 2016-05-22 DIAGNOSIS — E7849 Other hyperlipidemia: Secondary | ICD-10-CM

## 2016-05-22 NOTE — Telephone Encounter (Signed)
Left message for patient to call back.  Notes Recorded by Josue Hector, MD on 05/07/2016 at 5:04 PM EST LDL too high for CAD/PV disease increase lipitor to 80mg  alternating with 40 mg and repeat labs in 3 months

## 2016-05-22 NOTE — Telephone Encounter (Signed)
Patient is currently taking Crestor 20 mg every other day. Patient wants to know if he can start taking Crestor every day, instead of going back to Lipitor. Will forward to Dr. [name] for advisement.

## 2016-05-22 NOTE — Telephone Encounter (Signed)
Pt returning this office call please give him a call back. °

## 2016-05-23 NOTE — Telephone Encounter (Signed)
Yes he can take crestor every day and repeat labs in 3 months

## 2016-05-25 ENCOUNTER — Other Ambulatory Visit: Payer: Self-pay

## 2016-05-25 ENCOUNTER — Ambulatory Visit (HOSPITAL_COMMUNITY)
Admission: RE | Admit: 2016-05-25 | Discharge: 2016-05-25 | Disposition: A | Discharge status: Home / Self Care | Payer: Medicare Other | Source: Ambulatory Visit | Attending: Family | Admitting: Family

## 2016-05-25 ENCOUNTER — Encounter: Payer: Self-pay | Admitting: Family

## 2016-05-25 ENCOUNTER — Ambulatory Visit (INDEPENDENT_AMBULATORY_CARE_PROVIDER_SITE_OTHER): Payer: Medicare Other | Admitting: Family

## 2016-05-25 VITALS — BP 144/92 | HR 56 | Temp 97.8°F | Resp 18 | Ht 67.0 in | Wt 174.8 lb

## 2016-05-25 DIAGNOSIS — I723 Aneurysm of iliac artery: Secondary | ICD-10-CM

## 2016-05-25 DIAGNOSIS — I714 Abdominal aortic aneurysm, without rupture, unspecified: Secondary | ICD-10-CM

## 2016-05-25 DIAGNOSIS — R0989 Other specified symptoms and signs involving the circulatory and respiratory systems: Secondary | ICD-10-CM

## 2016-05-25 NOTE — Patient Instructions (Addendum)
Abdominal Aortic Aneurysm Blood pumps away from the heart through tubes (blood vessels) called arteries. Aneurysms are weak or damaged places in the wall of an artery. It bulges out like a balloon. An abdominal aortic aneurysm happens in the main artery of the body (aorta). It can burst or tear, causing bleeding inside the body. This is an emergency. It needs treatment right away. What are the causes? The exact cause is unknown. Things that could cause this problem include:  Fat and other substances building up in the lining of a tube.  Swelling of the walls of a blood vessel.  Certain tissue diseases.  Belly (abdominal) trauma.  An infection in the main artery of the body.  What increases the risk? There are things that make it more likely for you to have an aneurysm. These include:  Being over the age of 72 years old.  Having high blood pressure (hypertension).  Being a male.  Being white.  Being very overweight (obese).  Having a family history of aneurysm.  Using tobacco products.  What are the signs or symptoms? Symptoms depend on the size of the aneurysm and how fast it grows. There may not be symptoms. If symptoms occur, they can include:  Pain (belly, side, lower back, or groin).  Feeling full after eating a small amount of food.  Feeling sick to your stomach (nauseous), throwing up (vomiting), or both.  Feeling a lump in your belly that feels like it is beating (pulsating).  Feeling like you will pass out (faint).  How is this treated?  Medicine to control blood pressure and pain.  Imaging tests to see if the aneurysm gets bigger.  Surgery. How is this prevented? To lessen your chance of getting this condition:  Stop smoking. Stop chewing tobacco.  Limit or avoid alcohol.  Keep your blood pressure, blood sugar, and cholesterol within normal limits.  Eat less salt.  Eat foods low in saturated fats and cholesterol. These are found in animal and  whole dairy products.  Eat more fiber. Fiber is found in whole grains, vegetables, and fruits.  Keep a healthy weight.  Stay active and exercise often.  This information is not intended to replace advice given to you by your health care provider. Make sure you discuss any questions you have with your health care provider. Document Released: 09/19/2012 Document Revised: 10/31/2015 Document Reviewed: 06/24/2012 Elsevier Interactive Patient Education  2017 [employer]  

## 2016-05-25 NOTE — Progress Notes (Signed)
[facility] [place]  CC: Follow up abdominal aortic aneurysm and right common iliac artery aneurysm.   History of Present Illness Kevin Blake is a 70 y.o. male patient of Dr. Trula Slade who returns for follow-up of his aneurysm of his aorta and right common iliac which was initially diagnosed by ultrasound.  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  He also had imaging for popliteal aneurysm which was negative.  His carotid Doppler studies were normal.  Dr. Trula Slade last evaluated pt on 05-07-2014. 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  Therefore, Dr. Trula Slade recommended repeat ultrasound in 1 year.  He reports lots of gas, sometimes has diarrhea; states he has been evaluated by a gastroenterologist.   No recent creatinine result on file.   He is a former smoker, quit in 1997, started smoking at age 68.  He does not have DM.   He denies any hx of stroke or TIA.  He denies any back pain or abdominal pain.   Past Medical History:  Diagnosis Date  . AAA (abdominal aortic aneurysm) ([place])   . Arthritis   . Asthma   . CAD (coronary artery disease)    s/p angioplasty  . Chronic abdominal pain    RUQ  . Colon polyps   . Diverticulosis   . GERD (gastroesophageal reflux disease)   . HLD (hyperlipidemia)   . HTN (hypertension)   . IBS (irritable bowel syndrome)   . Skin cancer    Past Surgical History:  Procedure Laterality Date  . CARDIAC CATHETERIZATION  1997  . CHOLECYSTECTOMY    . CORONARY ANGIOPLASTY WITH STENT PLACEMENT  1997  . EXPLORATORY LAPAROTOMY    . KNEE SURGERY    . TENDON REPAIR     Social History Social History   Social History  . Marital status: Married    Spouse name:  N/A  . Number of children: 2  . Years of education: N/A   Occupational History  . retired    Social History Main Topics  . Smoking status: Former Smoker    Packs/day: 1.00    Quit date: 06/08/2000  . Smokeless tobacco: Never Used  . Alcohol use No  . Drug use: No  . Sexual activity: Not on file   Other Topics Concern  . Not on file   Social History Narrative  . No narrative on file   Family History Family History  Problem Relation Age of Onset  . Heart disease Mother   . Breast cancer Mother   . AAA (abdominal aortic aneurysm) Mother   . Heart disease Brother     before age 12  . Hyperlipidemia Brother   . Hypertension Brother   . Heart disease Son   . Liver disease Brother   . Kidney cancer Father   . Kidney disease Brother     Current Outpatient Prescriptions on File Prior to Visit  Medication Sig Dispense Refill  . aspirin 81 MG tablet Take 81 mg by mouth daily.      . fish oil-omega-3 fatty acids 1000 MG capsule Take 2 g by mouth 2 (two) times daily.      Marland Kitchen losartan (COZAAR) 50 MG tablet  Take 1 tablet (50 mg total) by mouth daily. 90 tablet 3  . nitroGLYCERIN (NITROSTAT) 0.4 MG SL tablet Place 1 tablet (0.4 mg total) under the tongue every 5 (five) minutes as needed. 25 tablet 4  . rifaximin (XIFAXAN) 550 MG TABS tablet Take one po TID x 2 weeks 42 tablet 0  . fluticasone (FLONASE) 50 MCG/ACT nasal spray Place 1 spray into both nostrils daily as needed for allergies or rhinitis.     Marland Kitchen nortriptyline (PAMELOR) 10 MG capsule Take 20 mg by mouth at bedtime.    . rosuvastatin (CRESTOR) 20 MG tablet Take 1 tablet (20 mg total) by mouth every Monday, Wednesday, and Friday. 12 tablet 6   No current facility-administered medications on file prior to visit.    No Known Allergies  ROS: See HPI for pertinent positives and negatives.  Physical Examination  Vitals:   05/25/16 0915 05/25/16 0921  BP: (!) 155/94 (!) 144/92  Pulse: (!) 56   Resp: 18   Temp: 97.8 F (36.6  C)   TempSrc: Oral   SpO2: 100%   Weight: 174 lb 12.8 oz (79.3 kg)   Height: 5\' 7"  (1.702 m)    Body mass index is 27.38 kg/m.  General: A&O x 3, WD, male.  Pulmonary: Sym exp, respirations are non labored, good air movt, CTAB, no rales, rhonchi, or wheezing.  Cardiac: RRR, Nl S1, S2, no detected murmur.   Carotid Bruits Right Left   Negative Negative   Aorta is not palpable Radial pulses are 2+ palpable                           VASCULAR EXAM:                                                                                                         LE Pulses Right Left       FEMORAL  2+ palpable  2+ palpable        POPLITEAL  3+ palpable   3+ palpable       POSTERIOR TIBIAL  2+ palpable   2+ palpable        DORSALIS PEDIS      ANTERIOR TIBIAL 2+ palpable  1+ palpable      Gastrointestinal: soft, NTND, -G/R, - HSM, - masses palpated, - CVAT B.  Musculoskeletal: M/S 5/5 throughout, Extremities without ischemic changes.  Neurologic: CN 2-12 intact, Pain and light touch intact in extremities are intact except, Motor exam as listed above.  Non-Invasive Vascular Imaging  AAA Duplex (05/25/2016)  Previous size: 3.48 cm cm (Date: 05-13-15) Right CIA: 2.83 cm  Current size:  3.6 cm (Date: 05-25-16) Right CIA: 3.2 cm; Left CIA: 1.7 cm. Decreased visualization of the abdominal vasculature due to overlying bowel gas. No significant stenosis of the abdominal aorta and bilateral proximal common iliac arteries. Aneurysmal dilatation of the mid to distal abdominal aorta and right proximal-mid common iliac artery with maximum diameter measurements noted above.   Stable small abdominal aortic aneurysm, increase  in the diameter of the right common iliac artery compared to the last exam on 05-13-15.  Medical Decision Making  The patient is a 70 y.o. male who presents with asymptomatic AAA with no increase in size, but right CIA has increased to 3.2 cm, from 2.83 cm a year ago, based  on limited visualization due to overlying bowel gas.   Based on this patient's exam and diagnostic studies, and after discussing the right CIA size with Dr. Oneida Alar, the patient will be scheduled for follow up in 2-3 weeks with the following studies: CTA abd/pelvis, follow up with Dr. Trula Slade; will also include duplex of bilateral popliteal arteries to evaluate for popliteal aneurysm; indication is prominent bilateral popliteal pulses. Popliteal arteries were evaluated by duplex in 2014, no aneurysms at that time.   Consideration for repair of AAA would be made when the size is 5.0 cm, growth > 1 cm/yr, and symptomatic status.  I emphasized the importance of maximal medical management including strict control of blood pressure, blood glucose, and lipid levels, antiplatelet agents, obtaining regular exercise, and continued  cessation of smoking.   The patient was given information about AAA including signs, symptoms, treatment, and how to minimize the risk of enlargement and rupture of aneurysms.    The patient was advised to call 911 should the patient experience sudden onset abdominal or back pain.   Thank you for allowing [place] to participate in this patient's care.  Clemon Chambers, RN, MSN, FNP-C [facility] [place] Office: [phone]  Clinic Physician: Oneida Alar  05/25/2016, 9:31 AM

## 2016-05-26 MED ORDER — ROSUVASTATIN CALCIUM 20 MG PO TABS: 20.0000 mg | ORAL_TABLET | Freq: Every day | ORAL | 3 refills | Status: DC

## 2016-05-26 NOTE — Telephone Encounter (Signed)
Called patient back about Dr. Kyla Balzarine recommendations. Patient verbalized understanding and will repeat lab work 08/24/16 at his office visit

## 2016-05-26 NOTE — Telephone Encounter (Signed)
Pt is returning [place] call

## 2016-05-26 NOTE — Telephone Encounter (Signed)
Left message for patient to call back  

## 2016-05-29 ENCOUNTER — Encounter: Payer: Self-pay | Admitting: Surgery

## 2016-06-04 ENCOUNTER — Telehealth (HOSPITAL_COMMUNITY): Payer: Self-pay | Admitting: *Deleted

## 2016-06-04 NOTE — Telephone Encounter (Signed)
Explained appointments to patient.  Patient was confused regarding his different appoints, ie CT, [place], and Physician.

## 2016-06-10 ENCOUNTER — Ambulatory Visit
Admission: RE | Admit: 2016-06-10 | Discharge: 2016-06-10 | Disposition: A | Discharge status: Home / Self Care | Payer: Medicare Other | Source: Ambulatory Visit | Attending: Family | Admitting: Family

## 2016-06-10 DIAGNOSIS — I723 Aneurysm of iliac artery: Secondary | ICD-10-CM

## 2016-06-10 MED ORDER — IOPAMIDOL (ISOVUE-370) INJECTION 76%: 75.0000 mL | Freq: Once | INTRAVENOUS | Status: DC | PRN

## 2016-06-11 ENCOUNTER — Ambulatory Visit (HOSPITAL_COMMUNITY)
Admission: RE | Admit: 2016-06-11 | Discharge: 2016-06-11 | Disposition: A | Discharge status: Home / Self Care | Payer: Medicare Other | Source: Ambulatory Visit | Attending: Family | Admitting: Family

## 2016-06-11 DIAGNOSIS — I723 Aneurysm of iliac artery: Secondary | ICD-10-CM | POA: Diagnosis not present

## 2016-06-15 ENCOUNTER — Encounter: Payer: Self-pay | Admitting: Surgery

## 2016-06-15 ENCOUNTER — Ambulatory Visit (INDEPENDENT_AMBULATORY_CARE_PROVIDER_SITE_OTHER): Payer: Medicare Other | Admitting: Surgery

## 2016-06-15 VITALS — BP 158/98 | HR 66 | Temp 97.2°F | Resp 18 | Ht 67.0 in | Wt 178.0 lb

## 2016-06-15 DIAGNOSIS — I714 Abdominal aortic aneurysm, without rupture, unspecified: Secondary | ICD-10-CM

## 2016-06-15 NOTE — Progress Notes (Signed)
[facility]  Patient name: Kevin Blake MRN: [id number] DOB: 10-Nov-1945 Sex: male  REASON FOR VISIT: follow aup  HPI: Kevin Blake is a 71 y.o. male returns today for follow-up of his aortoiliac aneurysmal disease.  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  Ultrasound evaluation of popliteal aneurysm was negative.  He recently had an ultrasound performed at our office which revealed a 3.2 cm right common iliac aneurysm.  He was referred for CT scan and is here today for follow-up.  He denies any abdominal pain.  He takes a statin for hypercholesterolemia.  He is on a ARB for hypertension  Past Medical History:  Diagnosis Date  . AAA (abdominal aortic aneurysm) ([place])   . Arthritis   . Asthma   . CAD (coronary artery disease)    s/p angioplasty  . Chronic abdominal pain    RUQ  . Colon polyps   . Diverticulosis   . GERD (gastroesophageal reflux disease)   . HLD (hyperlipidemia)   . HTN (hypertension)   . IBS (irritable bowel syndrome)   . Skin cancer     Family History  Problem Relation Age of Onset  . Heart disease Mother   . Breast cancer Mother   . AAA (abdominal aortic aneurysm) Mother   . Heart disease Brother     before age 82  . Hyperlipidemia Brother   . Hypertension Brother   . Heart disease Son   . Liver disease Brother   . Kidney cancer Father   . Kidney disease Brother     SOCIAL HISTORY: Social History  Substance Use Topics  . Smoking status: Former Smoker    Packs/day: 1.00    Quit date: 06/08/2000  . Smokeless tobacco: Never Used  . Alcohol use No    No Known Allergies  Current Outpatient Prescriptions  Medication Sig Dispense Refill  . aspirin 81 MG tablet Take 81 mg by mouth daily.      . fish oil-omega-3 fatty acids 1000 MG capsule Take 2 g by mouth 2 (two) times daily.      .  fluticasone (FLONASE) 50 MCG/ACT nasal spray Place 1 spray into both nostrils daily as needed for allergies or rhinitis.     Marland Kitchen losartan (COZAAR) 50 MG tablet     . nitroGLYCERIN (NITROSTAT) 0.4 MG SL tablet Place 1 tablet (0.4 mg total) under the tongue every 5 (five) minutes as needed. 25 tablet 4  . rosuvastatin (CRESTOR) 20 MG tablet Take 1 tablet (20 mg total) by mouth daily. 90 tablet 3   No current facility-administered medications for this visit.     REVIEW OF SYSTEMS:  [X]  denotes positive finding, [ ]  denotes negative finding Cardiac  Comments:  Chest pain or chest pressure:    Shortness of breath upon exertion:    Short of breath when lying flat:    Irregular heart rhythm:        Vascular    Pain in calf, thigh, or hip brought on by ambulation:    Pain in feet at night that wakes you up from your sleep:     Blood clot in your veins:    Leg swelling:         Pulmonary    Oxygen at home:    Productive cough:     Wheezing:  Neurologic    Sudden weakness in arms or legs:     Sudden numbness in arms or legs:     Sudden onset of difficulty speaking or slurred speech:    Temporary loss of vision in one eye:     Problems with dizziness:         Gastrointestinal    Blood in stool:     Vomited blood:         Genitourinary    Burning when urinating:     Blood in urine:        Psychiatric    Major depression:         Hematologic    Bleeding problems:    Problems with blood clotting too easily:        Skin    Rashes or ulcers:        Constitutional    Fever or chills:      PHYSICAL EXAM: Vitals:   06/15/16 0859 06/15/16 0910  BP: (!) 156/98 (!) 158/98  Pulse: 64 66  Resp: 18   Temp: 97.2 F (36.2 C)   TempSrc: Oral   SpO2: 97%   Weight: 178 lb (80.7 kg)   Height: 5\' 7"  (1.702 m)     GENERAL: The patient is a well-nourished male, in no acute distress. The vital signs are documented above. CARDIAC: There is a regular rate and rhythm.    VASCULAR: Nonpalpable pedal pulses PULMONARY: Non-labored respirations ABDOMEN: Soft and non-tender with normal pitched bowel sounds.  MUSCULOSKELETAL: There are no major deformities or cyanosis. NEUROLOGIC: No focal weakness or paresthesias are detected. SKIN: There are no ulcers or rashes noted. PSYCHIATRIC: The patient has a normal affect.  DATA:  I have reviewed his CT angiogram with the following findings: 1. Slight enlargement of fusiform infrarenal abdominal aortic aneurysm now measuring 4 cm in greatest diameter. This previously measured 3.7 cm by CTA in 2014. 2. Interval enlargement of right common iliac artery aneurysm now measuring 3.0- 3.2 cm compared to 2.6 cm previously. No evidence of rupture.  MEDICAL ISSUES: AAA:  We discussed treatment indications and options.  The right common iliac aneurysm measures 3.0 cm by my measurement.  He is at    Annamarie Major, MD [facility] [phone] Pager [phone]

## 2016-06-16 ENCOUNTER — Other Ambulatory Visit: Payer: Self-pay

## 2016-06-16 DIAGNOSIS — I714 Abdominal aortic aneurysm, without rupture, unspecified: Secondary | ICD-10-CM

## 2016-07-06 ENCOUNTER — Telehealth: Payer: Self-pay | Admitting: Surgery

## 2016-07-06 NOTE — Telephone Encounter (Signed)
-----   Message from Serafina Mitchell, MD sent at 07/03/2016  8:50 AM EST ----- Regarding: RE: CTA vs AAA clarification Please just do the CTA ----- Message ----- From: Georgiann Mccoy Sent: 07/02/2016  10:29 AM To: Serafina Mitchell, MD Subject: CTA vs AAA clarification                       You saw this pt on 06/15/16. The blue sheet is marked down for both a AAA ultrasound and CTA Abd/Pel in 6 m. It was my understanding that we don't do both at the same time; however, the note in Epic is incomplete.  Which test should we schedule?  Thank you, Selinda Eon

## 2016-08-11 NOTE — Progress Notes (Signed)
This is a 71 y.o.  gentleman who is here today for a followup visit. He has history of coronary artery disease status post angioplasty and stent placement in 1997. No cardiac events since then. Most recent stress test was in 2009 which showed no evidence of ischemia. He also has history of hypertension and hyperlipidemia. Overall, he has been doing well. He denies any chest pain or dyspnea.  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 Sees Dr Marcello Moores in [place] as primary.    Sees [facility] for vascular disease  05/25/16 right iliac 3.2 cm AAA 3.6 cm   Carotid artery duplex: Less than 40% bilateral internal carotid stenosis  Ankle-brachial indices: 1.1 on the right and 1.0 on the left, both with triphasic waveforms.  Lower extremity duplex: No evidence of popliteal aneurysm.  Has chronic abdominal pain Etiology not clear has seen GI ? IBS  BP is elevated still having a bit of salt   ROS: Denies fever, malais, weight loss, blurry vision, decreased visual acuity, cough, sputum, SOB, hemoptysis, pleuritic pain, palpitaitons, heartburn, abdominal pain, melena, lower extremity edema, claudication, or rash.  All other systems reviewed and negative  General: Affect appropriate Healthy:  appears stated age 23: normal Neck supple with no adenopathy JVP normal no bruits no thyromegaly Lungs clear with no wheezing and good diaphragmatic motion Heart:  S1/S2 no murmur, no rub, gallop or click PMI normal Abdomen: benighn, BS positve, no tenderness, no AAA no bruit.  No HSM or HJR Distal pulses intact with no bruits No edema Neuro non-focal Skin warm and dry No muscular weakness   Current Outpatient Prescriptions  Medication Sig Dispense Refill  . aspirin 81 MG tablet Take 81 mg by mouth daily.      . fish oil-omega-3 fatty acids 1000 MG capsule Take 2 g by mouth 2 (two) times daily.      . fluticasone (FLONASE) 50 MCG/ACT nasal spray Place 1 spray into  both nostrils daily as needed for allergies or rhinitis.     Marland Kitchen losartan (COZAAR) 100 MG tablet Take 1 tablet (100 mg total) by mouth daily. 90 tablet 3  . nitroGLYCERIN (NITROSTAT) 0.4 MG SL tablet Place 1 tablet (0.4 mg total) under the tongue every 5 (five) minutes as needed. 25 tablet 4  . rosuvastatin (CRESTOR) 20 MG tablet Take 1 tablet (20 mg total) by mouth daily. 90 tablet 3   No current facility-administered medications for this visit.     Allergies  Patient has no known allergies.  Electrocardiogram:  01/10/14  SR rate 58  RBBB   01/2015  SR  Rate 63  RBBB   Assessment and Plan  CAD: Distant RCA stent f/u ETT   02/19/15 was normal stable observe New nitro called in  HTN:   Increase cozaar to 100mg  daily f/u Dr Marcello Moores   Iliac Aneurysm  Duplex 05/25/16 3.2 right iliac   AAA 3.6 cm stable  f/u Dr Trula Slade   Chol:  Taking crestor daily now   Lab Results  Component Value Date   LDLCALC 117 (H) 05/07/2016   IBS:  F/u GI no pathology identified in past          Jenkins Rouge

## 2016-08-17 ENCOUNTER — Other Ambulatory Visit: Payer: Self-pay | Admitting: Cardiovascular Disease

## 2016-08-24 ENCOUNTER — Encounter: Payer: Self-pay | Admitting: Cardiovascular Disease

## 2016-08-24 ENCOUNTER — Ambulatory Visit (INDEPENDENT_AMBULATORY_CARE_PROVIDER_SITE_OTHER): Payer: Medicare Other | Admitting: Cardiovascular Disease

## 2016-08-24 ENCOUNTER — Other Ambulatory Visit: Payer: Medicare Other | Admitting: *Deleted

## 2016-08-24 DIAGNOSIS — Z79899 Other long term (current) drug therapy: Secondary | ICD-10-CM

## 2016-08-24 DIAGNOSIS — E784 Other hyperlipidemia: Secondary | ICD-10-CM

## 2016-08-24 DIAGNOSIS — E7849 Other hyperlipidemia: Secondary | ICD-10-CM

## 2016-08-24 LAB — LIPID PANEL
CHOLESTEROL TOTAL: 143 mg/dL (ref 100–199)
Chol/HDL Ratio: 3.5 ratio units (ref 0.0–5.0)
HDL: 41 mg/dL (ref >39)
LDL Calculated: 84 mg/dL (ref 0–99)
TRIGLYCERIDES: 88 mg/dL (ref 0–149)
VLDL Cholesterol Cal: 18 mg/dL (ref 5–40)

## 2016-08-24 LAB — HEPATIC FUNCTION PANEL
ALBUMIN: 4.6 g/dL (ref 3.5–4.8)
ALT: 22 IU/L (ref 0–44)
AST: 24 IU/L (ref 0–40)
Alkaline Phosphatase: 67 IU/L (ref 39–117)
BILIRUBIN TOTAL: 0.8 mg/dL (ref 0.0–1.2)
BILIRUBIN, DIRECT: 0.23 mg/dL (ref 0.00–0.40)
Total Protein: 6.4 g/dL (ref 6.0–8.5)

## 2016-08-24 MED ORDER — NITROGLYCERIN 0.4 MG SL SUBL: 0.4000 mg | SUBLINGUAL_TABLET | SUBLINGUAL | 4 refills | Status: DC | PRN

## 2016-08-24 MED ORDER — LOSARTAN POTASSIUM 100 MG PO TABS: 100.0000 mg | ORAL_TABLET | Freq: Every day | ORAL | 3 refills | Status: DC

## 2016-08-24 NOTE — Addendum Note (Signed)
Addended by: Eulis Foster on: 08/24/2016 10:20 AM   Modules accepted: Orders

## 2016-08-24 NOTE — Patient Instructions (Addendum)
Medication Instructions:  Your physician has recommended you make the following change in your medication:  1-Increase Losartan 100 mg by mouth daily.  Labwork: Your physician recommends that you have lab work today - lipid and liver panel.  Testing/Procedures: NONE  Follow-Up: Your physician wants you to follow-up in: 12 months with Dr. [name]. You will receive a reminder letter in the mail two months in advance. If you don't receive a letter, please call our office to schedule the follow-up appointment.   If you need a refill on your cardiac medications before your next appointment, please call your pharmacy.

## 2016-08-24 NOTE — Addendum Note (Signed)
Addended by: Eulis Foster on: 08/24/2016 10:19 AM   Modules accepted: Orders

## 2016-08-25 ENCOUNTER — Telehealth: Payer: Self-pay | Admitting: Cardiovascular Disease

## 2016-08-25 NOTE — Telephone Encounter (Signed)
Called patient with his lab results  

## 2016-08-25 NOTE — Telephone Encounter (Signed)
New Message ° ° pt verbalized that he is returning call for rn  °

## 2016-12-08 ENCOUNTER — Ambulatory Visit
Admission: RE | Admit: 2016-12-08 | Discharge: 2016-12-08 | Disposition: A | Discharge status: Home / Self Care | Payer: Medicare Other | Source: Ambulatory Visit | Attending: Surgery | Admitting: Surgery

## 2016-12-08 DIAGNOSIS — I714 Abdominal aortic aneurysm, without rupture, unspecified: Secondary | ICD-10-CM

## 2016-12-08 MED ORDER — IOPAMIDOL (ISOVUE-370) INJECTION 76%
100.0000 mL | Freq: Once | INTRAVENOUS | Status: AC | PRN
  Administered 2016-12-08: 100 mL via INTRAVENOUS

## 2016-12-14 ENCOUNTER — Telehealth: Payer: Self-pay | Admitting: *Deleted

## 2016-12-14 ENCOUNTER — Other Ambulatory Visit (HOSPITAL_COMMUNITY): Payer: Medicare Other

## 2016-12-14 ENCOUNTER — Ambulatory Visit: Payer: Medicare Other | Admitting: Surgery

## 2016-12-14 NOTE — Telephone Encounter (Signed)
I let the patient know that Dr. Trula Slade would not be in the office again until 12-28-16. He still wishes to get a verbal report of his CTA because he lives in [place] and has to pay someone to bring him to the office.

## 2016-12-14 NOTE — Telephone Encounter (Signed)
-----   Message from Rufina Falco sent at 12/14/2016  8:13 AM EDT ----- Regarding: Results of CTA scan (upset pt) Contact: [phone] Mr. Blackwell showed up before 8am this morning.   (his appt is sch'd 07/23 to see Dr. Trula Slade)- he said he is not making another trip to this office.   He said Dr. Trula Slade can review his CTA and call or mail him a letter.   (If I understood him correctly he said this was the third time this has happened to him) He was saying a lot!   Rip Harbour

## 2016-12-16 ENCOUNTER — Encounter: Payer: Self-pay | Admitting: Surgery

## 2016-12-28 ENCOUNTER — Other Ambulatory Visit: Payer: Self-pay | Admitting: *Deleted

## 2016-12-28 ENCOUNTER — Ambulatory Visit: Payer: Medicare Other | Admitting: Surgery

## 2016-12-28 DIAGNOSIS — I723 Aneurysm of iliac artery: Secondary | ICD-10-CM

## 2016-12-28 DIAGNOSIS — I714 Abdominal aortic aneurysm, without rupture, unspecified: Secondary | ICD-10-CM

## 2017-06-10 ENCOUNTER — Other Ambulatory Visit: Payer: Self-pay | Admitting: Cardiovascular Disease

## 2017-06-10 DIAGNOSIS — E7849 Other hyperlipidemia: Secondary | ICD-10-CM

## 2017-06-14 ENCOUNTER — Encounter: Payer: Self-pay | Admitting: Surgery

## 2017-06-14 ENCOUNTER — Ambulatory Visit: Payer: Medicare Other | Admitting: Surgery

## 2017-06-14 ENCOUNTER — Encounter: Payer: Self-pay | Admitting: *Deleted

## 2017-06-14 ENCOUNTER — Ambulatory Visit
Admission: RE | Admit: 2017-06-14 | Discharge: 2017-06-14 | Disposition: A | Discharge status: Home / Self Care | Payer: Medicare Other | Source: Ambulatory Visit | Attending: Surgery | Admitting: Surgery

## 2017-06-14 VITALS — BP 162/92 | HR 72 | Temp 97.5°F | Resp 16 | Ht 67.0 in | Wt 183.0 lb

## 2017-06-14 DIAGNOSIS — I714 Abdominal aortic aneurysm, without rupture, unspecified: Secondary | ICD-10-CM

## 2017-06-14 DIAGNOSIS — I723 Aneurysm of iliac artery: Secondary | ICD-10-CM

## 2017-06-14 MED ORDER — IOPAMIDOL (ISOVUE-370) INJECTION 76%
75.0000 mL | Freq: Once | INTRAVENOUS | Status: AC | PRN
  Administered 2017-06-14: 75 mL via INTRAVENOUS

## 2017-06-14 NOTE — Progress Notes (Signed)
[facility]  Patient name: Kevin Blake MRN: [id number] DOB: 05/09/1946 Sex: male   REASON FOR VISIT:    Follow up AAA  HISOTRY OF PRESENT ILLNESS:    Kevin Blake is a 72 y.o. 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  He had a popliteal ultrasound which was negative for aneurysmal disease.  In 2018, his aorta had increased to 4 cm in size.  The right common iliac had increased to 3.0.  The right common iliac and 2018 repeat CT scan was 2.7 cm.  He is back today for repeat CT scan.  He denies any abdominal pain or back pain.  He takes a statin for hypercholesterolemia.  He is on a ARB for hypertension.  He also suffers from coronary artery disease.  PAST MEDICAL HISTORY:   Past Medical History:  Diagnosis Date  . AAA (abdominal aortic aneurysm) ([place])   . Arthritis   . Asthma   . CAD (coronary artery disease)    s/p angioplasty  . Chronic abdominal pain    RUQ  . Colon polyps   . Diverticulosis   . GERD (gastroesophageal reflux disease)   . HLD (hyperlipidemia)   . HTN (hypertension)   . IBS (irritable bowel syndrome)   . Skin cancer      FAMILY HISTORY:   Family History  Problem Relation Age of Onset  . Heart disease Mother   . Breast cancer Mother   . AAA (abdominal aortic aneurysm) Mother   . Heart disease Brother        before age 2  . Hyperlipidemia Brother   . Hypertension Brother   . Heart disease Son   . Liver disease Brother   . Kidney cancer Father   . Kidney disease Brother     SOCIAL HISTORY:   Social History   Tobacco Use  . Smoking status: Former Smoker    Packs/day: 1.00    Last attempt to quit: 06/08/2000    Years since quitting: 17.0  . Smokeless tobacco: Never Used  Substance Use Topics  . Alcohol use: No   Alcohol/week: 0.0 oz     ALLERGIES:   No Known Allergies   CURRENT MEDICATIONS:   Current Outpatient Medications  Medication Sig Dispense Refill  . aspirin 81 MG tablet Take 81 mg by mouth daily.      . fish oil-omega-3 fatty acids 1000 MG capsule Take 2 g by mouth 2 (two) times daily.      . fluticasone (FLONASE) 50 MCG/ACT nasal spray Place 1 spray into both nostrils daily as needed for allergies or rhinitis.     Marland Kitchen losartan (COZAAR) 100 MG tablet Take 1 tablet (100 mg total) by mouth daily. 90 tablet 3  . nitroGLYCERIN (NITROSTAT) 0.4 MG SL tablet Place 1 tablet (0.4 mg total) under the tongue every 5 (five) minutes as needed. 25 tablet 4  . rosuvastatin (CRESTOR) 20 MG tablet TAKE 1 TABLET ONCE DAILY. 90 tablet 1   No current facility-administered medications for this visit.     REVIEW OF SYSTEMS:   [X]  denotes positive finding, [ ]  denotes negative finding Cardiac  Comments:  Chest pain or chest pressure:    Shortness of breath upon exertion:    Short of breath when lying flat:    Irregular heart rhythm:  Vascular    Pain in calf, thigh, or hip brought on by ambulation:    Pain in feet at night that wakes you up from your sleep:     Blood clot in your veins:    Leg swelling:         Pulmonary    Oxygen at home:    Productive cough:     Wheezing:         Neurologic    Sudden weakness in arms or legs:     Sudden numbness in arms or legs:     Sudden onset of difficulty speaking or slurred speech:    Temporary loss of vision in one eye:     Problems with dizziness:         Gastrointestinal    Blood in stool:     Vomited blood:         Genitourinary    Burning when urinating:     Blood in urine:        Psychiatric    Major depression:         Hematologic    Bleeding problems:    Problems with blood clotting too easily:        Skin    Rashes or ulcers:        Constitutional    Fever or chills:      PHYSICAL EXAM:   Vitals:   06/14/17 0943   BP: (!) 162/92  Pulse: 72  Resp: 16  Temp: (!) 97.5 F (36.4 C)  TempSrc: Oral  SpO2: 98%  Weight: 183 lb (83 kg)  Height: 5\' 7"  (1.702 m)    GENERAL: The patient is a well-nourished male, in no acute distress. The vital signs are documented above. CARDIAC: There is a regular rate and rhythm.  VASCULAR: Palpable posterior tibial pulse bilaterally.  No carotid bruits PULMONARY: Non-labored respirations ABDOMEN: Soft and non-tender with normal pitched bowel sounds.  MUSCULOSKELETAL: There are no major deformities or cyanosis. NEUROLOGIC: No focal weakness or paresthesias are detected. SKIN: There are no ulcers or rashes noted. PSYCHIATRIC: The patient has a normal affect.  STUDIES:   I have reviewed his CT scan with the following findings: Study Result   CLINICAL DATA:  Evaluate abdominal aortic aneurysm  EXAM: CTA ABDOMEN AND PELVIS WITH CONTRAST  TECHNIQUE: Multidetector CT imaging of the abdomen and pelvis was performed using the standard protocol during bolus administration of intravenous contrast. Multiplanar reconstructed images and MIPs were obtained and reviewed to evaluate the vascular anatomy.  CONTRAST:  44mL ISOVUE-370 IOPAMIDOL (ISOVUE-370) INJECTION 76%  Creatinine was obtained on site at [facility] at [address]  Results: Creatinine 1.0 mg/dL.  COMPARISON:  12/18/2016; 06/10/2016; 03/31/2013  FINDINGS: VASCULAR  Aorta: Bilobed infrarenal abdominal aortic aneurysm is unchanged to very minimally increased in size compared to the 12/2016 examination, with dominant caudal component again measuring 3.9 x 3.9 x 4.0 as measured in greatest oblique short axis axial (image 125, series 5), coronal (image 83, series 8 and sagittal (image 40, series 9) dimensions respectively, previously, 3.7 x 3.8 x 4.0 cm.  The cranial component of the bilobed aneurysm has also likely minimally increased in size, currently measuring 3.4 x 3.3 x  3.4 cm as measured in greatest oblique axial (image 103, series 5, coronal (image 85, series 8) and sagittal (image 87, series 9) dimensions respectively, previously, 3.3 x 3.2 x 3.4 cm.  The aneurysm is noted to originate approximately 1.7 cm caudal to  the take-off of the most inferior left renal artery and terminate at the level of the aortic bifurcation.  Re- demonstrated moderate amount of mixed calcified and noncalcified atherosclerotic plaque throughout the abdominal aorta, not resulting in hemodynamically significant stenosis. There is a moderate amount of mural thrombus within the dominant component of both aneurysms including minimal serpiginous opacification of the mural thrombus involving the cranial aspect of the bilobed aneurysm (98, series 5). No abdominal aortic dissection or periaortic stranding.  Celiac: Minimal amount of mixed calcified and noncalcified atherosclerotic plaque involving the origin the celiac artery, not resulting in a hemodynamically significant stenosis. Conventional branching pattern.  SMA: Minimal amount of mixed calcified and noncalcified atherosclerotic plaque involving the origin the SMA, not resulting in hemodynamically significant stenosis. Conventional branching pattern. The distal tributaries the SMA are widely patent without discrete intraluminal filling defect suggest distal embolism.  Renals: Solitary bilaterally there is a minimal amount of mixed calcified and noncalcified atherosclerotic plaque involving the origin of the bilateral renal artery's, not resulting in hemodynamically significant stenosis. No vessel irregularity to suggest FMD.  IMA: Remains patent.  Inflow: As above, the in for abdominal aortic aneurysm tapers to near normal caliber at the level of the aortic bifurcation however there is a separate aneurysm involving the right common iliac artery which has likely minimally increased in size in the  interval, currently measuring 2.9 x 3.0 x 3.3 cm as measured in greatest oblique axial (image 52, series 5, coronal (image 92, series 8) and sagittal (image 87, series 9) dimensions respectively, previously, 2.7 x 2.7 x 3.0 cm. The right common iliac artery again tapers to a normal caliber at the level of the vessel's bifurcation.  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 No periaortic stranding.  Moderate amount of mixed calcified and noncalcified atherosclerotic plaque with a normal caliber left common iliac artery. Mild ectasia involving the left internal iliac artery with associated mural thrombus, again measuring 1.3 cm in diameter (162, series 5). The right internal iliac artery is disease though patent of normal caliber. The bilateral external iliac artery is are tortuous though widely patent without hemodynamically significant stenosis.  Proximal Outflow: Minimal amount of predominantly calcified atherosclerotic plaque within bilateral common and imaged portions of the bilateral superficial femoral arteries, not resulting in hemodynamically significant stenosis.  Veins: The pelvic venous system and IVC appear widely patent on this arterial phase examination.  Review of the MIP images confirms the above findings.  NON-VASCULAR  Evaluation of the abdominal organs is largely limited to the arterial phase of enhancement.  Lower chest: Limited visualization lower thorax demonstrates minimal subsegmental atelectasis within the medial basilar segment of the right lower lobe. Pleural calcifications are again seen within the tendon portion of the left lower lobe (image 42, series 12), unchanged. No focal airspace opacities. No evidence of honeycombing. No pleural effusions.  Normal heart size. Coronary artery calcifications. No pericardial effusion.  Hepatobiliary: Normal  hepatic contour. Note is made of a minimal amount of focal fatty infiltration adjacent to the fissure for ligamentum teres. Re- demonstrated multiple hypoattenuating nonenhancing hepatic cysts with dominant cyst within the dome of the liver measuring approximately 2.3 cm. No discrete hyperenhancing hepatic lesions. Punctate granuloma are seen scattered within the liver. Post cholecystectomy. No intra or extrahepatic biliary ductal dilatation. No ascites.  Pancreas: Normal appearance of the pancreas  Spleen: Punctate granuloma within otherwise normal-appearing spleen.  Adrenals/Urinary Tract: There is  symmetric enhancement and excretion of the bilateral kidneys. Note is made of an approximately 6.5 x 4.9 cm hypoattenuating nonenhancing exophytic right-sided renal cyst, unchanged. No discrete left-sided renal lesions. No evidence of nephrolithiasis on this postcontrast examination. Minimal amount of grossly symmetric likely age and body habitus related perinephric stranding. No urine obstruction.  Normal appearance the bilateral adrenal glands.  Normal appearance of the urinary bladder given degree distention.  Stomach/Bowel: Rather extensive colonic diverticulosis without evidence of superimposed acute diverticulitis. Normal appearance of the terminal ileum and retrocecal appendix. No pneumoperitoneum, pneumatosis or portal venous gas.  Lymphatic: Calcified celiac plexus lymph nodes, the sequela of prior granulomatous infection. No bulky retroperitoneal, mesenteric, pelvic or inguinal lymphadenopathy.  Reproductive: Dystrophic calcifications within normal sized prostate gland.  Other: Small bilateral mesenteric fat containing inguinal hernias, left greater than right. Tiny mesenteric fat containing periumbilical hernia.  Musculoskeletal: No acute or aggressive osseous abnormalities. 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 Moderate severe multilevel lumbar  spine DDD, worse at L3-L4, L4-L5 and L5-S1 with disc space height loss, endplate irregularity and sclerosis.  IMPRESSION: VASCULAR:  1. Suspected minimal increase in size of bilobed infrarenal abdominal aortic aneurysm with dominant caudal component now measuring 4.0 x 3.9 x 3.9, previously, 4.0 x 3.8 x 3.7 cm, and caudal component now measuring 3.4 x 3.4 x 3.3 cm, previously, 3.4 x 3.3 x 3.2 cm. Aortic aneurysm NOS (ICD10-I71.9). 2. Suspected minimal increase in size of right common iliac artery aneurysm, currently measuring 3.3 x 3.0 x 2.9 cm, previously, 3.0 x 2.7 x 2.7 cm. 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 3. Unchanged ectasia of the left common iliac artery measuring 1.3 cm in diameter with associated mural thrombus. 4. Aortic Atherosclerosis (ICD10-I70.0). NON-VASCULAR:  1. Extensive colonic diverticulosis without evidence of diverticulitis. 2. Sequela of prior granulomatous disease as above.     MEDICAL ISSUES:   Aortoiliac aneurysmal disease: We discussed that repair is considered at this size.  I discussed placing an iliac branch device to address the right common iliac aneurysm.  Details of the operation were discussed with the patient including the risk of cardiopulmonary complications, endoleak, buttock claudication, intestinal ischemia, renal insufficiency, and lower extremity arterial disease progression.  He also understands that he may not be a candidate for an iliac branch device and may require embolization of his hypogastric artery.  We will schedule his operation within the next month.  I will get cardiology clearance from Dr. Elmon Else, MD [facility] [phone] Pager [phone]

## 2017-06-14 NOTE — H&P (View-Only) (Signed)
Vascular and Vein Specialist of [facility]  Patient name: Kevin Blake MRN: [id number] DOB: 01/08/1946 Sex: male   REASON FOR VISIT:    Follow up AAA  HISOTRY OF PRESENT ILLNESS:    Kevin Blake is a 72 y.o. 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  He had a popliteal ultrasound which was negative for aneurysmal disease.  In 2018, his aorta had increased to 4 cm in size.  The right common iliac had increased to 3.0.  The right common iliac and 2018 repeat CT scan was 2.7 cm.  He is back today for repeat CT scan.  He denies any abdominal pain or back pain.  He takes a statin for hypercholesterolemia.  He is on a ARB for hypertension.  He also suffers from coronary artery disease.  PAST MEDICAL HISTORY:   Past Medical History:  Diagnosis Date  . AAA (abdominal aortic aneurysm) ([place])   . Arthritis   . Asthma   . CAD (coronary artery disease)    s/p angioplasty  . Chronic abdominal pain    RUQ  . Colon polyps   . Diverticulosis   . GERD (gastroesophageal reflux disease)   . HLD (hyperlipidemia)   . HTN (hypertension)   . IBS (irritable bowel syndrome)   . Skin cancer      FAMILY HISTORY:   Family History  Problem Relation Age of Onset  . Heart disease Mother   . Breast cancer Mother   . AAA (abdominal aortic aneurysm) Mother   . Heart disease Brother        before age 10  . Hyperlipidemia Brother   . Hypertension Brother   . Heart disease Son   . Liver disease Brother   . Kidney cancer Father   . Kidney disease Brother     SOCIAL HISTORY:   Social History   Tobacco Use  . Smoking status: Former Smoker    Packs/day: 1.00    Last attempt to quit: 06/08/2000    Years since quitting: 17.0  . Smokeless tobacco: Never Used  Substance Use Topics  . Alcohol use: No   Alcohol/week: 0.0 oz     ALLERGIES:   No Known Allergies   CURRENT MEDICATIONS:   Current Outpatient Medications  Medication Sig Dispense Refill  . aspirin 81 MG tablet Take 81 mg by mouth daily.      . fish oil-omega-3 fatty acids 1000 MG capsule Take 2 g by mouth 2 (two) times daily.      . fluticasone (FLONASE) 50 MCG/ACT nasal spray Place 1 spray into both nostrils daily as needed for allergies or rhinitis.     Marland Kitchen losartan (COZAAR) 100 MG tablet Take 1 tablet (100 mg total) by mouth daily. 90 tablet 3  . nitroGLYCERIN (NITROSTAT) 0.4 MG SL tablet Place 1 tablet (0.4 mg total) under the tongue every 5 (five) minutes as needed. 25 tablet 4  . rosuvastatin (CRESTOR) 20 MG tablet TAKE 1 TABLET ONCE DAILY. 90 tablet 1   No current facility-administered medications for this visit.     REVIEW OF SYSTEMS:   [X]  denotes positive finding, [ ]  denotes negative finding Cardiac  Comments:  Chest pain or chest pressure:    Shortness of breath upon exertion:    Short of breath when lying flat:    Irregular heart rhythm:  Vascular    Pain in calf, thigh, or hip brought on by ambulation:    Pain in feet at night that wakes you up from your sleep:     Blood clot in your veins:    Leg swelling:         Pulmonary    Oxygen at home:    Productive cough:     Wheezing:         Neurologic    Sudden weakness in arms or legs:     Sudden numbness in arms or legs:     Sudden onset of difficulty speaking or slurred speech:    Temporary loss of vision in one eye:     Problems with dizziness:         Gastrointestinal    Blood in stool:     Vomited blood:         Genitourinary    Burning when urinating:     Blood in urine:        Psychiatric    Major depression:         Hematologic    Bleeding problems:    Problems with blood clotting too easily:        Skin    Rashes or ulcers:        Constitutional    Fever or chills:      PHYSICAL EXAM:   Vitals:   06/14/17 0943   BP: (!) 162/92  Pulse: 72  Resp: 16  Temp: (!) 97.5 F (36.4 C)  TempSrc: Oral  SpO2: 98%  Weight: 183 lb (83 kg)  Height: 5\' 7"  (1.702 m)    GENERAL: The patient is a well-nourished male, in no acute distress. The vital signs are documented above. CARDIAC: There is a regular rate and rhythm.  VASCULAR: Palpable posterior tibial pulse bilaterally.  No carotid bruits PULMONARY: Non-labored respirations ABDOMEN: Soft and non-tender with normal pitched bowel sounds.  MUSCULOSKELETAL: There are no major deformities or cyanosis. NEUROLOGIC: No focal weakness or paresthesias are detected. SKIN: There are no ulcers or rashes noted. PSYCHIATRIC: The patient has a normal affect.  STUDIES:   I have reviewed his CT scan with the following findings: Study Result   CLINICAL DATA:  Evaluate abdominal aortic aneurysm  EXAM: CTA ABDOMEN AND PELVIS WITH CONTRAST  TECHNIQUE: Multidetector CT imaging of the abdomen and pelvis was performed using the standard protocol during bolus administration of intravenous contrast. Multiplanar reconstructed images and MIPs were obtained and reviewed to evaluate the vascular anatomy.  CONTRAST:  59mL ISOVUE-370 IOPAMIDOL (ISOVUE-370) INJECTION 76%  Creatinine was obtained on site at [facility] at [address]  Results: Creatinine 1.0 mg/dL.  COMPARISON:  12/18/2016; 06/10/2016; 03/31/2013  FINDINGS: VASCULAR  Aorta: Bilobed infrarenal abdominal aortic aneurysm is unchanged to very minimally increased in size compared to the 12/2016 examination, with dominant caudal component again measuring 3.9 x 3.9 x 4.0 as measured in greatest oblique short axis axial (image 125, series 5), coronal (image 83, series 8 and sagittal (image 40, series 9) dimensions respectively, previously, 3.7 x 3.8 x 4.0 cm.  The cranial component of the bilobed aneurysm has also likely minimally increased in size, currently measuring 3.4 x 3.3 x  3.4 cm as measured in greatest oblique axial (image 103, series 5, coronal (image 85, series 8) and sagittal (image 87, series 9) dimensions respectively, previously, 3.3 x 3.2 x 3.4 cm.  The aneurysm is noted to originate approximately 1.7 cm caudal to  the take-off of the most inferior left renal artery and terminate at the level of the aortic bifurcation.  Re- demonstrated moderate amount of mixed calcified and noncalcified atherosclerotic plaque throughout the abdominal aorta, not resulting in hemodynamically significant stenosis. There is a moderate amount of mural thrombus within the dominant component of both aneurysms including minimal serpiginous opacification of the mural thrombus involving the cranial aspect of the bilobed aneurysm (98, series 5). No abdominal aortic dissection or periaortic stranding.  Celiac: Minimal amount of mixed calcified and noncalcified atherosclerotic plaque involving the origin the celiac artery, not resulting in a hemodynamically significant stenosis. Conventional branching pattern.  SMA: Minimal amount of mixed calcified and noncalcified atherosclerotic plaque involving the origin the SMA, not resulting in hemodynamically significant stenosis. Conventional branching pattern. The distal tributaries the SMA are widely patent without discrete intraluminal filling defect suggest distal embolism.  Renals: Solitary bilaterally there is a minimal amount of mixed calcified and noncalcified atherosclerotic plaque involving the origin of the bilateral renal artery's, not resulting in hemodynamically significant stenosis. No vessel irregularity to suggest FMD.  IMA: Remains patent.  Inflow: As above, the in for abdominal aortic aneurysm tapers to near normal caliber at the level of the aortic bifurcation however there is a separate aneurysm involving the right common iliac artery which has likely minimally increased in size in the  interval, currently measuring 2.9 x 3.0 x 3.3 cm as measured in greatest oblique axial (image 52, series 5, coronal (image 92, series 8) and sagittal (image 87, series 9) dimensions respectively, previously, 2.7 x 2.7 x 3.0 cm. The right common iliac artery again tapers to a normal caliber at the level of the vessel's bifurcation.  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 No periaortic stranding.  Moderate amount of mixed calcified and noncalcified atherosclerotic plaque with a normal caliber left common iliac artery. Mild ectasia involving the left internal iliac artery with associated mural thrombus, again measuring 1.3 cm in diameter (162, series 5). The right internal iliac artery is disease though patent of normal caliber. The bilateral external iliac artery is are tortuous though widely patent without hemodynamically significant stenosis.  Proximal Outflow: Minimal amount of predominantly calcified atherosclerotic plaque within bilateral common and imaged portions of the bilateral superficial femoral arteries, not resulting in hemodynamically significant stenosis.  Veins: The pelvic venous system and IVC appear widely patent on this arterial phase examination.  Review of the MIP images confirms the above findings.  NON-VASCULAR  Evaluation of the abdominal organs is largely limited to the arterial phase of enhancement.  Lower chest: Limited visualization lower thorax demonstrates minimal subsegmental atelectasis within the medial basilar segment of the right lower lobe. Pleural calcifications are again seen within the tendon portion of the left lower lobe (image 42, series 12), unchanged. No focal airspace opacities. No evidence of honeycombing. No pleural effusions.  Normal heart size. Coronary artery calcifications. No pericardial effusion.  Hepatobiliary: Normal  hepatic contour. Note is made of a minimal amount of focal fatty infiltration adjacent to the fissure for ligamentum teres. Re- demonstrated multiple hypoattenuating nonenhancing hepatic cysts with dominant cyst within the dome of the liver measuring approximately 2.3 cm. No discrete hyperenhancing hepatic lesions. Punctate granuloma are seen scattered within the liver. Post cholecystectomy. No intra or extrahepatic biliary ductal dilatation. No ascites.  Pancreas: Normal appearance of the pancreas  Spleen: Punctate granuloma within otherwise normal-appearing spleen.  Adrenals/Urinary Tract: There is  symmetric enhancement and excretion of the bilateral kidneys. Note is made of an approximately 6.5 x 4.9 cm hypoattenuating nonenhancing exophytic right-sided renal cyst, unchanged. No discrete left-sided renal lesions. No evidence of nephrolithiasis on this postcontrast examination. Minimal amount of grossly symmetric likely age and body habitus related perinephric stranding. No urine obstruction.  Normal appearance the bilateral adrenal glands.  Normal appearance of the urinary bladder given degree distention.  Stomach/Bowel: Rather extensive colonic diverticulosis without evidence of superimposed acute diverticulitis. Normal appearance of the terminal ileum and retrocecal appendix. No pneumoperitoneum, pneumatosis or portal venous gas.  Lymphatic: Calcified celiac plexus lymph nodes, the sequela of prior granulomatous infection. No bulky retroperitoneal, mesenteric, pelvic or inguinal lymphadenopathy.  Reproductive: Dystrophic calcifications within normal sized prostate gland.  Other: Small bilateral mesenteric fat containing inguinal hernias, left greater than right. Tiny mesenteric fat containing periumbilical hernia.  Musculoskeletal: No acute or aggressive osseous abnormalities. 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 Moderate severe multilevel lumbar  spine DDD, worse at L3-L4, L4-L5 and L5-S1 with disc space height loss, endplate irregularity and sclerosis.  IMPRESSION: VASCULAR:  1. Suspected minimal increase in size of bilobed infrarenal abdominal aortic aneurysm with dominant caudal component now measuring 4.0 x 3.9 x 3.9, previously, 4.0 x 3.8 x 3.7 cm, and caudal component now measuring 3.4 x 3.4 x 3.3 cm, previously, 3.4 x 3.3 x 3.2 cm. Aortic aneurysm NOS (ICD10-I71.9). 2. Suspected minimal increase in size of right common iliac artery aneurysm, currently measuring 3.3 x 3.0 x 2.9 cm, previously, 3.0 x 2.7 x 2.7 cm. 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 3. Unchanged ectasia of the left common iliac artery measuring 1.3 cm in diameter with associated mural thrombus. 4. Aortic Atherosclerosis (ICD10-I70.0). NON-VASCULAR:  1. Extensive colonic diverticulosis without evidence of diverticulitis. 2. Sequela of prior granulomatous disease as above.     MEDICAL ISSUES:   Aortoiliac aneurysmal disease: We discussed that repair is considered at this size.  I discussed placing an iliac branch device to address the right common iliac aneurysm.  Details of the operation were discussed with the patient including the risk of cardiopulmonary complications, endoleak, buttock claudication, intestinal ischemia, renal insufficiency, and lower extremity arterial disease progression.  He also understands that he may not be a candidate for an iliac branch device and may require embolization of his hypogastric artery.  We will schedule his operation within the next month.  I will get cardiology clearance from Dr. Elmon Else, MD [facility] [phone] Pager [phone]

## 2017-06-15 ENCOUNTER — Telehealth: Payer: Self-pay | Admitting: *Deleted

## 2017-06-15 NOTE — Telephone Encounter (Signed)
-----   Message from Josue Hector, MD sent at 06/15/2017  2:18 PM EST ----- Regarding: RE: Cardiac Clearance NEDDED  Ok to proceed with EVAR  ----- Message ----- From: Willy Eddy, RN Sent: 06/14/2017  12:57 PM To: Josue Hector, MD Subject: Cardiac Clearance NEDDED                       Dr. Trula Slade has scheduled this patient for EVAR on 07/09/2017. Cardiac Clearance needed pre-op. Please contact me if you need anything further. Thank you, Archivist

## 2017-06-18 ENCOUNTER — Other Ambulatory Visit: Payer: Self-pay | Admitting: *Deleted

## 2017-06-18 ENCOUNTER — Ambulatory Visit: Payer: Medicare Other | Admitting: Cardiovascular Disease

## 2017-06-18 NOTE — Progress Notes (Signed)
Patient called and instructed surgery date will be 07/14/17. To be at [facility] admitting at 6:30 am. Follow the instruction as written in letter and follow the detailed instructions he receives from the pre-admission testing department about this surgery.

## 2017-06-23 ENCOUNTER — Telehealth: Payer: Self-pay | Admitting: Surgery

## 2017-06-23 NOTE — Telephone Encounter (Signed)
Spoke with patient about surgery and the possibility of right hypo embolization vs iliac branch device.  Decision will be made in the OR.  Discussed low risk of buttock claudication.

## 2017-07-05 ENCOUNTER — Ambulatory Visit: Payer: Medicare Other | Admitting: Cardiology

## 2017-07-07 NOTE — Pre-Procedure Instructions (Signed)
Kevin Blake  07/07/2017      [facility] - [place], [place] - [address] [place]  [address] Phone: [phone] Fax: [phone]    Your procedure is scheduled on February 6  Report to [place] at [place].M.  Call this number if you have problems the morning of surgery:  [phone]   Remember:  Do not eat food or drink liquids after midnight.  Continue all medications as directed by your physician except follow these medication instructions before surgery below   Take these medicines the morning of surgery with A SIP OF WATER  fluticasone (FLONASE loratadine (CLARITIN) ranitidine (ZANTAC) nitroGLYCERIN (NITROSTAT)  If needed for chest pain - notify nurse if used  7 days prior to surgery STOP taking any Aspirin(unless otherwise instructed by your surgeon), Aleve, Naproxen, Ibuprofen, Motrin, Advil, Goody's, BC's, all herbal medications, fish oil, and all vitamins  Follow your doctors instructions regarding your Aspirin.  If no instructions were given by your doctor, then you will need to call the prescribing office office to get instructions.      Do not wear jewelry  Do not wear lotions, powders, or cologne, or deodorant.  Men may shave face and neck.  Do not bring valuables to the hospital.  [facility] is not responsible for any belongings or valuables.  Contacts, dentures or bridgework may not be worn into surgery.  Leave your suitcase in the car.  After surgery it may be brought to your room.  For patients admitted to the hospital, discharge time will be determined by your treatment team.  Patients discharged the day of surgery will not be allowed to drive home.    Special instructions:   [place]- Preparing For Surgery  Before surgery, you can play an important role. Because skin is not sterile, your skin needs to be as free of germs as possible. You can reduce the number of germs on your  skin by washing with CHG (chlorahexidine gluconate) Soap before surgery.  CHG is an antiseptic cleaner which kills germs and bonds with the skin to continue killing germs even after washing.  Please do not use if you have an allergy to CHG or antibacterial soaps. If your skin becomes reddened/irritated stop using the CHG.  Do not shave (including legs and underarms) for at least 48 hours prior to first CHG shower. It is OK to shave your face.  Please follow these instructions carefully.   1. Shower the NIGHT BEFORE SURGERY and the MORNING OF SURGERY with CHG.   2. If you chose to wash your hair, wash your hair first as usual with your normal shampoo.  3. After you shampoo, rinse your hair and body thoroughly to remove the shampoo.  4. Use CHG as you would any other liquid soap. You can apply CHG directly to the skin and wash gently with a scrungie or a clean washcloth.   5. Apply the CHG Soap to your body ONLY FROM THE NECK DOWN.  Do not use on open wounds or open sores. Avoid contact with your eyes, ears, mouth and genitals (private parts). Wash Face and genitals (private parts)  with your normal soap.  6. Wash thoroughly, paying special attention to the area where your surgery will be performed.  7. Thoroughly rinse your body with warm water from the neck down.  8. DO NOT shower/wash with your normal soap after using and rinsing  off the CHG Soap.  9. Pat yourself dry with a CLEAN TOWEL.  10. Wear CLEAN PAJAMAS to bed the night before surgery, wear comfortable clothes the morning of surgery  11. Place CLEAN SHEETS on your bed the night of your first shower and DO NOT SLEEP WITH PETS.    Day of Surgery: Do not apply any deodorants/lotions. Please wear clean clothes to the hospital/surgery center.      Please read over the following fact sheets that you were given.

## 2017-07-08 ENCOUNTER — Encounter (HOSPITAL_COMMUNITY): Payer: Self-pay

## 2017-07-08 ENCOUNTER — Other Ambulatory Visit: Payer: Self-pay

## 2017-07-08 ENCOUNTER — Encounter (HOSPITAL_COMMUNITY)
Admission: RE | Admit: 2017-07-08 | Discharge: 2017-07-08 | Disposition: A | Discharge status: Home / Self Care | Payer: Medicare Other | Source: Ambulatory Visit | Attending: Surgery | Admitting: Surgery

## 2017-07-08 ENCOUNTER — Other Ambulatory Visit: Payer: Self-pay | Admitting: *Deleted

## 2017-07-08 DIAGNOSIS — K219 Gastro-esophageal reflux disease without esophagitis: Secondary | ICD-10-CM | POA: Diagnosis not present

## 2017-07-08 DIAGNOSIS — Z9889 Other specified postprocedural states: Secondary | ICD-10-CM | POA: Insufficient documentation

## 2017-07-08 DIAGNOSIS — Z87891 Personal history of nicotine dependence: Secondary | ICD-10-CM | POA: Insufficient documentation

## 2017-07-08 DIAGNOSIS — I10 Essential (primary) hypertension: Secondary | ICD-10-CM | POA: Diagnosis not present

## 2017-07-08 DIAGNOSIS — Z7982 Long term (current) use of aspirin: Secondary | ICD-10-CM | POA: Insufficient documentation

## 2017-07-08 DIAGNOSIS — Z01818 Encounter for other preprocedural examination: Secondary | ICD-10-CM | POA: Diagnosis not present

## 2017-07-08 DIAGNOSIS — Z7901 Long term (current) use of anticoagulants: Secondary | ICD-10-CM | POA: Diagnosis not present

## 2017-07-08 DIAGNOSIS — I2510 Atherosclerotic heart disease of native coronary artery without angina pectoris: Secondary | ICD-10-CM | POA: Insufficient documentation

## 2017-07-08 DIAGNOSIS — Z79899 Other long term (current) drug therapy: Secondary | ICD-10-CM | POA: Insufficient documentation

## 2017-07-08 DIAGNOSIS — E785 Hyperlipidemia, unspecified: Secondary | ICD-10-CM | POA: Diagnosis not present

## 2017-07-08 LAB — COMPREHENSIVE METABOLIC PANEL
ALK PHOS: 62 U/L (ref 38–126)
ALT: 24 U/L (ref 17–63)
ANION GAP: 12 (ref 5–15)
AST: 25 U/L (ref 15–41)
Albumin: 4.1 g/dL (ref 3.5–5.0)
BILIRUBIN TOTAL: 0.8 mg/dL (ref 0.3–1.2)
BUN: 9 mg/dL (ref 6–20)
CALCIUM: 9.2 mg/dL (ref 8.9–10.3)
CO2: 18 mmol/L — ABNORMAL LOW (ref 22–32)
CREATININE: 0.85 mg/dL (ref 0.61–1.24)
Chloride: 108 mmol/L (ref 101–111)
Glucose, Bld: 102 mg/dL — ABNORMAL HIGH (ref 65–99)
Potassium: 3.9 mmol/L (ref 3.5–5.1)
Sodium: 138 mmol/L (ref 135–145)
TOTAL PROTEIN: 6.9 g/dL (ref 6.5–8.1)

## 2017-07-08 LAB — BLOOD GAS, ARTERIAL
Acid-Base Excess: 0.2 mmol/L (ref 0.0–2.0)
BICARBONATE: 23.9 mmol/L (ref 20.0–28.0)
Drawn by: 470591
FIO2: 21
O2 Saturation: 94.3 %
PATIENT TEMPERATURE: 98.6
PCO2 ART: 35.8 mmHg (ref 32.0–48.0)
PO2 ART: 71.7 mmHg — AB (ref 83.0–108.0)
pH, Arterial: 7.44 (ref 7.350–7.450)

## 2017-07-08 LAB — TYPE AND SCREEN
ABO/RH(D): O POS
Antibody Screen: NEGATIVE

## 2017-07-08 LAB — CBC
HEMATOCRIT: 45.5 % (ref 39.0–52.0)
HEMOGLOBIN: 15.6 g/dL (ref 13.0–17.0)
MCH: 30.7 pg (ref 26.0–34.0)
MCHC: 34.3 g/dL (ref 30.0–36.0)
MCV: 89.6 fL (ref 78.0–100.0)
Platelets: 168 10*3/uL (ref 150–400)
RBC: 5.08 MIL/uL (ref 4.22–5.81)
RDW: 12.8 % (ref 11.5–15.5)
WBC: 6.4 10*3/uL (ref 4.0–10.5)

## 2017-07-08 LAB — URINALYSIS, ROUTINE W REFLEX MICROSCOPIC
Bacteria, UA: NONE SEEN
Bilirubin Urine: NEGATIVE
GLUCOSE, UA: NEGATIVE mg/dL
KETONES UR: NEGATIVE mg/dL
LEUKOCYTES UA: NEGATIVE
Nitrite: NEGATIVE
PROTEIN: NEGATIVE mg/dL
SQUAMOUS EPITHELIAL / LPF: NONE SEEN
Specific Gravity, Urine: 1.009 (ref 1.005–1.030)
pH: 6 (ref 5.0–8.0)

## 2017-07-08 LAB — SURGICAL PCR SCREEN
MRSA, PCR: NEGATIVE
Staphylococcus aureus: NEGATIVE

## 2017-07-08 LAB — PROTIME-INR
INR: 0.93
Prothrombin Time: 12.3 seconds (ref 11.4–15.2)

## 2017-07-08 LAB — APTT: APTT: 28 s (ref 24–36)

## 2017-07-08 LAB — ABO/RH: ABO/RH(D): O POS

## 2017-07-08 NOTE — Progress Notes (Addendum)
PCP - Quintella Reichert Cardiologist - Nishan  Chest x-ray - not needed EKG - 07/08/17 Stress Test - 2016 ECHO - denies Cardiac Cath - over 20 years with stent placement    Aspirin Instructions: continue  Anesthesia review: YES  Patient denies shortness of breath, fever, cough and chest pain at PAT appointment   Patient verbalized understanding of instructions that were given to them at the PAT appointment. Patient was also instructed that they will need to review over the PAT instructions again at home before surgery.

## 2017-07-09 NOTE — Progress Notes (Signed)
Anesthesia Chart Review:  Pt is a 72 year old male scheduled for abdominal aortic endovascular stent graft, insertion of iliac branch device on 07/14/2017 with Harold Barban, MD  - PCP is Quintella Reichert, MD - Cardiologist is Jenkins Rouge, MD who cleared pt for surgery (see telephone encounter 06/15/17 by Domenic Moras, RN)  PMH includes:  CAD (s/p RCA stent 1997), HTN, hyperlipidemia, AAA, childhood asthma, GERD. Former smoker. BMI 27  Medications include: ASA 81mg , losartan, zantac, rosuvastatin  Preoperative labs reviewed.    EKG 07/08/17: Sinus bradycardia (54 bpm) with sinus arrhythmia. LAD. RBBB.   CT angio abdomen/pelvis 06/14/17:  - VASCULAR: 1. Suspected minimal increase in size of bilobed infrarenal abdominal aortic aneurysm with dominant caudal component now measuring 4.0 x 3.9 x 3.9, previously, 4.0 x 3.8 x 3.7 cm, and caudal component now measuring 3.4 x 3.4 x 3.3 cm, previously, 3.4 x 3.3 x 3.2 cm. Aortic aneurysm NOS (ICD10-I71.9). 2. Suspected minimal increase in size of right common iliac artery aneurysm, currently measuring 3.3 x 3.0 x 2.9 cm, previously, 3.0 x 2.7 x 2.7 cm. 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 3. 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 4. Aortic Atherosclerosis (ICD10-I70.0). - NON-VASCULAR: 1. Extensive colonic diverticulosis without evidence of diverticulitis. 2. Sequela of prior granulomatous disease as above.  Exercise tolerance test 02/19/15:   Blood pressure demonstrated a hypertensive response to exercise.  There was no ST segment deviation noted during stress.  No T wave inversion was noted during stress. - Normal ECG stress test. - Hypertension at baseline and hypertensive response to exercise.  If no changes, I anticipate pt can proceed with surgery as scheduled.    Willeen Cass, FNP-BC [facility]/Anesthesiology Phone: [phone] 07/09/2017 2:39 PM

## 2017-07-13 NOTE — Anesthesia Preprocedure Evaluation (Addendum)
Anesthesia Evaluation  Patient identified by MRN, date of birth, ID band Patient awake    Reviewed: Allergy & Precautions, NPO status , Patient's Chart, lab work & pertinent test results  History of Anesthesia Complications Negative for: history of anesthetic complications  Airway Mallampati: II  TM Distance: >3 FB Neck ROM: Full    Dental  (+) Caps, Dental Advisory Given   Pulmonary COPD, former smoker (quit 2002),    breath sounds clear to auscultation       Cardiovascular hypertension, Pt. on medications (-) angina+ CAD and + Peripheral Vascular Disease (infrarenal 3.7 cm aneurysm)   Rhythm:Regular Rate:Normal  '16 ex stress test: normal   Neuro/Psych negative neurological ROS     GI/Hepatic Neg liver ROS, GERD  Controlled and Medicated,  Endo/Other  negative endocrine ROS  Renal/GU negative Renal ROS     Musculoskeletal   Abdominal   Peds  Hematology negative hematology ROS (+)   Anesthesia Other Findings   Reproductive/Obstetrics                           Anesthesia Physical Anesthesia Plan  ASA: III  Anesthesia Plan: General   Post-op Pain Management:    Induction: Intravenous  PONV Risk Score and Plan: 2 and Ondansetron and Dexamethasone  Airway Management Planned: Oral ETT  Additional Equipment: Arterial line  Intra-op Plan:   Post-operative Plan: Extubation in OR  Informed Consent: I have reviewed the patients History and Physical, chart, labs and discussed the procedure including the risks, benefits and alternatives for the proposed anesthesia with the patient or authorized representative who has indicated his/her understanding and acceptance.   Dental advisory given  Plan Discussed with: CRNA and Surgeon  Anesthesia Plan Comments: (Plan routine monitors, A-line, GETA)        Anesthesia Quick Evaluation

## 2017-07-14 ENCOUNTER — Inpatient Hospital Stay (HOSPITAL_COMMUNITY): Payer: Medicare Other | Admitting: Anesthesiology

## 2017-07-14 ENCOUNTER — Inpatient Hospital Stay (HOSPITAL_COMMUNITY): Payer: Medicare Other

## 2017-07-14 ENCOUNTER — Inpatient Hospital Stay (HOSPITAL_COMMUNITY): Payer: Medicare Other | Admitting: Emergency Medicine

## 2017-07-14 ENCOUNTER — Encounter (HOSPITAL_COMMUNITY)
Admission: RE | Disposition: A | Discharge status: Home Health | Payer: Self-pay | Source: Ambulatory Visit | Attending: Surgery

## 2017-07-14 ENCOUNTER — Inpatient Hospital Stay (HOSPITAL_COMMUNITY)
Admission: RE | Admit: 2017-07-14 | Discharge: 2017-07-15 | DRG: 269 | Disposition: A | Discharge status: Home Health | Payer: Medicare Other | Source: Ambulatory Visit | Attending: Surgery | Admitting: Surgery

## 2017-07-14 DIAGNOSIS — I700 Atherosclerosis of aorta: Secondary | ICD-10-CM | POA: Diagnosis present

## 2017-07-14 DIAGNOSIS — Z803 Family history of malignant neoplasm of breast: Secondary | ICD-10-CM

## 2017-07-14 DIAGNOSIS — Z95828 Presence of other vascular implants and grafts: Secondary | ICD-10-CM

## 2017-07-14 DIAGNOSIS — K5730 Diverticulosis of large intestine without perforation or abscess without bleeding: Secondary | ICD-10-CM | POA: Diagnosis present

## 2017-07-14 DIAGNOSIS — Z86010 Personal history of colonic polyps: Secondary | ICD-10-CM | POA: Diagnosis not present

## 2017-07-14 DIAGNOSIS — J45909 Unspecified asthma, uncomplicated: Secondary | ICD-10-CM | POA: Diagnosis present

## 2017-07-14 DIAGNOSIS — I723 Aneurysm of iliac artery: Secondary | ICD-10-CM | POA: Diagnosis present

## 2017-07-14 DIAGNOSIS — K589 Irritable bowel syndrome without diarrhea: Secondary | ICD-10-CM | POA: Diagnosis present

## 2017-07-14 DIAGNOSIS — N281 Cyst of kidney, acquired: Secondary | ICD-10-CM | POA: Diagnosis present

## 2017-07-14 DIAGNOSIS — Z8249 Family history of ischemic heart disease and other diseases of the circulatory system: Secondary | ICD-10-CM | POA: Diagnosis not present

## 2017-07-14 DIAGNOSIS — Z8051 Family history of malignant neoplasm of kidney: Secondary | ICD-10-CM

## 2017-07-14 DIAGNOSIS — K4020 Bilateral inguinal hernia, without obstruction or gangrene, not specified as recurrent: Secondary | ICD-10-CM | POA: Diagnosis present

## 2017-07-14 DIAGNOSIS — Z841 Family history of disorders of kidney and ureter: Secondary | ICD-10-CM

## 2017-07-14 DIAGNOSIS — Z87891 Personal history of nicotine dependence: Secondary | ICD-10-CM | POA: Diagnosis not present

## 2017-07-14 DIAGNOSIS — Z8349 Family history of other endocrine, nutritional and metabolic diseases: Secondary | ICD-10-CM | POA: Diagnosis not present

## 2017-07-14 DIAGNOSIS — K7689 Other specified diseases of liver: Secondary | ICD-10-CM | POA: Diagnosis present

## 2017-07-14 DIAGNOSIS — Z85828 Personal history of other malignant neoplasm of skin: Secondary | ICD-10-CM

## 2017-07-14 DIAGNOSIS — I513 Intracardiac thrombosis, not elsewhere classified: Secondary | ICD-10-CM | POA: Diagnosis present

## 2017-07-14 DIAGNOSIS — I714 Abdominal aortic aneurysm, without rupture, unspecified: Secondary | ICD-10-CM | POA: Diagnosis present

## 2017-07-14 DIAGNOSIS — Z7982 Long term (current) use of aspirin: Secondary | ICD-10-CM

## 2017-07-14 DIAGNOSIS — E7800 Pure hypercholesterolemia, unspecified: Secondary | ICD-10-CM | POA: Diagnosis present

## 2017-07-14 DIAGNOSIS — E785 Hyperlipidemia, unspecified: Secondary | ICD-10-CM | POA: Diagnosis present

## 2017-07-14 DIAGNOSIS — K219 Gastro-esophageal reflux disease without esophagitis: Secondary | ICD-10-CM | POA: Diagnosis present

## 2017-07-14 DIAGNOSIS — Z888 Allergy status to other drugs, medicaments and biological substances status: Secondary | ICD-10-CM

## 2017-07-14 DIAGNOSIS — Z8679 Personal history of other diseases of the circulatory system: Secondary | ICD-10-CM

## 2017-07-14 DIAGNOSIS — I2510 Atherosclerotic heart disease of native coronary artery without angina pectoris: Secondary | ICD-10-CM | POA: Diagnosis present

## 2017-07-14 DIAGNOSIS — E739 Lactose intolerance, unspecified: Secondary | ICD-10-CM | POA: Diagnosis present

## 2017-07-14 DIAGNOSIS — I10 Essential (primary) hypertension: Secondary | ICD-10-CM | POA: Diagnosis present

## 2017-07-14 HISTORY — PX: ABDOMINAL AORTIC ENDOVASCULAR STENT GRAFT: SHX5707

## 2017-07-14 LAB — BASIC METABOLIC PANEL
Anion gap: 9 (ref 5–15)
BUN: 9 mg/dL (ref 6–20)
CALCIUM: 8.3 mg/dL — AB (ref 8.9–10.3)
CO2: 21 mmol/L — ABNORMAL LOW (ref 22–32)
CREATININE: 0.81 mg/dL (ref 0.61–1.24)
Chloride: 110 mmol/L (ref 101–111)
GFR calc Af Amer: >60 mL/min (ref >60)
GLUCOSE: 94 mg/dL (ref 65–99)
POTASSIUM: 3.7 mmol/L (ref 3.5–5.1)
Sodium: 140 mmol/L (ref 135–145)

## 2017-07-14 LAB — CBC
HCT: 38.7 % — ABNORMAL LOW (ref 39.0–52.0)
Hemoglobin: 13.2 g/dL (ref 13.0–17.0)
MCH: 30.3 pg (ref 26.0–34.0)
MCHC: 34.1 g/dL (ref 30.0–36.0)
MCV: 89 fL (ref 78.0–100.0)
PLATELETS: 148 10*3/uL — AB (ref 150–400)
RBC: 4.35 MIL/uL (ref 4.22–5.81)
RDW: 12.6 % (ref 11.5–15.5)
WBC: 5.1 10*3/uL (ref 4.0–10.5)

## 2017-07-14 LAB — PROTIME-INR
INR: 1.06
Prothrombin Time: 13.7 seconds (ref 11.4–15.2)

## 2017-07-14 LAB — MAGNESIUM: MAGNESIUM: 1.9 mg/dL (ref 1.7–2.4)

## 2017-07-14 LAB — POCT ACTIVATED CLOTTING TIME: Activated Clotting Time: 164 seconds

## 2017-07-14 LAB — APTT: aPTT: 32 seconds (ref 24–36)

## 2017-07-14 SURGERY — INSERTION, ENDOVASCULAR STENT GRAFT, AORTA, ABDOMINAL
Anesthesia: General | Site: Groin

## 2017-07-14 MED ORDER — FENTANYL CITRATE (PF) 100 MCG/2ML IJ SOLN: 25.0000 ug | INTRAMUSCULAR | Status: DC | PRN

## 2017-07-14 MED ORDER — SUGAMMADEX SODIUM 200 MG/2ML IV SOLN
INTRAVENOUS | Status: DC | PRN
Start: 2017-07-14 — End: 2017-07-14
  Administered 2017-07-14: 200 mg via INTRAVENOUS

## 2017-07-14 MED ORDER — DEXTROSE 5 % IV SOLN
1.5000 g | INTRAVENOUS | Status: AC
  Administered 2017-07-14: 1.5 g via INTRAVENOUS
  Filled 2017-07-14: qty 1.5

## 2017-07-14 MED ORDER — POLYETHYLENE GLYCOL 3350 17 G PO PACK: 17.0000 g | PACK | Freq: Every day | ORAL | Status: DC | PRN

## 2017-07-14 MED ORDER — ONDANSETRON HCL 4 MG/2ML IJ SOLN
INTRAMUSCULAR | Status: DC | PRN
  Administered 2017-07-14: 4 mg via INTRAVENOUS

## 2017-07-14 MED ORDER — ALUM & MAG HYDROXIDE-SIMETH 200-200-20 MG/5ML PO SUSP: 15.0000 mL | ORAL | Status: DC | PRN

## 2017-07-14 MED ORDER — EPHEDRINE SULFATE 50 MG/ML IJ SOLN
INTRAMUSCULAR | Status: DC | PRN
  Administered 2017-07-14: 10 mg via INTRAVENOUS

## 2017-07-14 MED ORDER — SUCCINYLCHOLINE CHLORIDE 200 MG/10ML IV SOSY
PREFILLED_SYRINGE | INTRAVENOUS | Status: AC
  Filled 2017-07-14: qty 10

## 2017-07-14 MED ORDER — CHLORHEXIDINE GLUCONATE 4 % EX LIQD: 60.0000 mL | Freq: Once | CUTANEOUS | Status: DC

## 2017-07-14 MED ORDER — ONDANSETRON HCL 4 MG/2ML IJ SOLN: 4.0000 mg | Freq: Four times a day (QID) | INTRAMUSCULAR | Status: DC | PRN

## 2017-07-14 MED ORDER — LACTATED RINGERS IV SOLN
INTRAVENOUS | Status: DC | PRN
  Administered 2017-07-14: 08:00:00 via INTRAVENOUS

## 2017-07-14 MED ORDER — ACETAMINOPHEN 650 MG RE SUPP: 325.0000 mg | RECTAL | Status: DC | PRN

## 2017-07-14 MED ORDER — MEPERIDINE HCL 50 MG/ML IJ SOLN: 6.2500 mg | INTRAMUSCULAR | Status: DC | PRN

## 2017-07-14 MED ORDER — GLYCOPYRROLATE 0.2 MG/ML IJ SOLN
INTRAMUSCULAR | Status: DC | PRN
  Administered 2017-07-14: 0.2 mg via INTRAVENOUS

## 2017-07-14 MED ORDER — SUGAMMADEX SODIUM 200 MG/2ML IV SOLN
INTRAVENOUS | Status: AC
  Filled 2017-07-14: qty 2

## 2017-07-14 MED ORDER — FENTANYL CITRATE (PF) 250 MCG/5ML IJ SOLN
INTRAMUSCULAR | Status: DC | PRN
  Administered 2017-07-14: 100 ug via INTRAVENOUS
  Administered 2017-07-14: 150 ug via INTRAVENOUS

## 2017-07-14 MED ORDER — ROCURONIUM BROMIDE 100 MG/10ML IV SOLN
INTRAVENOUS | Status: DC | PRN
  Administered 2017-07-14: 50 mg via INTRAVENOUS
  Administered 2017-07-14 (×2): 20 mg via INTRAVENOUS

## 2017-07-14 MED ORDER — MIDAZOLAM HCL 2 MG/2ML IJ SOLN: 0.5000 mg | Freq: Once | INTRAMUSCULAR | Status: DC | PRN

## 2017-07-14 MED ORDER — OXYCODONE-ACETAMINOPHEN 5-325 MG PO TABS: 1.0000 | ORAL_TABLET | ORAL | Status: DC | PRN

## 2017-07-14 MED ORDER — PANTOPRAZOLE SODIUM 40 MG PO TBEC
40.0000 mg | DELAYED_RELEASE_TABLET | Freq: Every day | ORAL | Status: DC
  Administered 2017-07-14 – 2017-07-15 (×2): 40 mg via ORAL
  Filled 2017-07-14 (×2): qty 1

## 2017-07-14 MED ORDER — ONDANSETRON HCL 4 MG/2ML IJ SOLN
INTRAMUSCULAR | Status: AC
  Filled 2017-07-14: qty 2

## 2017-07-14 MED ORDER — ACETAMINOPHEN 325 MG PO TABS: 325.0000 mg | ORAL_TABLET | ORAL | Status: DC | PRN

## 2017-07-14 MED ORDER — PROPOFOL 10 MG/ML IV BOLUS
INTRAVENOUS | Status: DC | PRN
  Administered 2017-07-14: 100 mg via INTRAVENOUS

## 2017-07-14 MED ORDER — ASPIRIN EC 81 MG PO TBEC
81.0000 mg | DELAYED_RELEASE_TABLET | Freq: Every day | ORAL | Status: DC
  Administered 2017-07-15: 81 mg via ORAL
  Filled 2017-07-14: qty 1

## 2017-07-14 MED ORDER — MIDAZOLAM HCL 2 MG/2ML IJ SOLN
INTRAMUSCULAR | Status: DC | PRN
  Administered 2017-07-14: 1 mg via INTRAVENOUS

## 2017-07-14 MED ORDER — METOPROLOL TARTRATE 5 MG/5ML IV SOLN: 2.0000 mg | INTRAVENOUS | Status: DC | PRN

## 2017-07-14 MED ORDER — DEXTROSE 5 % IV SOLN
1.5000 g | Freq: Two times a day (BID) | INTRAVENOUS | Status: AC
  Administered 2017-07-14 – 2017-07-15 (×2): 1.5 g via INTRAVENOUS
  Filled 2017-07-14 (×2): qty 1.5

## 2017-07-14 MED ORDER — GUAIFENESIN-DM 100-10 MG/5ML PO SYRP: 15.0000 mL | ORAL_SOLUTION | ORAL | Status: DC | PRN

## 2017-07-14 MED ORDER — PHENYLEPHRINE HCL 10 MG/ML IJ SOLN
INTRAMUSCULAR | Status: DC | PRN
  Administered 2017-07-14: 80 ug via INTRAVENOUS
  Administered 2017-07-14: 40 ug via INTRAVENOUS

## 2017-07-14 MED ORDER — PHENYLEPHRINE HCL 10 MG/ML IJ SOLN
INTRAVENOUS | Status: DC | PRN
  Administered 2017-07-14: 45 ug/min via INTRAVENOUS

## 2017-07-14 MED ORDER — PROPOFOL 10 MG/ML IV BOLUS
INTRAVENOUS | Status: AC
  Filled 2017-07-14: qty 20

## 2017-07-14 MED ORDER — HEPARIN SODIUM (PORCINE) 1000 UNIT/ML IJ SOLN
INTRAMUSCULAR | Status: AC
  Filled 2017-07-14: qty 2

## 2017-07-14 MED ORDER — ROSUVASTATIN CALCIUM 10 MG PO TABS
20.0000 mg | ORAL_TABLET | Freq: Every day | ORAL | Status: DC
  Administered 2017-07-15: 20 mg via ORAL
  Filled 2017-07-14: qty 2

## 2017-07-14 MED ORDER — LABETALOL HCL 5 MG/ML IV SOLN: 10.0000 mg | INTRAVENOUS | Status: DC | PRN

## 2017-07-14 MED ORDER — SODIUM CHLORIDE 0.9 % IV SOLN: INTRAVENOUS | Status: DC

## 2017-07-14 MED ORDER — SODIUM CHLORIDE 0.9 % IV SOLN
INTRAVENOUS | Status: DC | PRN
  Administered 2017-07-14 (×2): 500 mL

## 2017-07-14 MED ORDER — LACTATED RINGERS IV SOLN
INTRAVENOUS | Status: DC | PRN
  Administered 2017-07-14 (×2): via INTRAVENOUS

## 2017-07-14 MED ORDER — FLUTICASONE PROPIONATE 50 MCG/ACT NA SUSP
1.0000 | Freq: Every day | NASAL | Status: DC | PRN
  Filled 2017-07-14: qty 16

## 2017-07-14 MED ORDER — HEPARIN SODIUM (PORCINE) 5000 UNIT/ML IJ SOLN: 5000.0000 [IU] | Freq: Three times a day (TID) | INTRAMUSCULAR | Status: DC

## 2017-07-14 MED ORDER — LIDOCAINE 2% (20 MG/ML) 5 ML SYRINGE
INTRAMUSCULAR | Status: AC
  Filled 2017-07-14: qty 5

## 2017-07-14 MED ORDER — NITROGLYCERIN 0.4 MG SL SUBL: 0.4000 mg | SUBLINGUAL_TABLET | SUBLINGUAL | Status: DC | PRN

## 2017-07-14 MED ORDER — MORPHINE SULFATE (PF) 2 MG/ML IV SOLN: 2.0000 mg | INTRAVENOUS | Status: DC | PRN

## 2017-07-14 MED ORDER — PROTAMINE SULFATE 10 MG/ML IV SOLN
INTRAVENOUS | Status: DC | PRN
  Administered 2017-07-14: 50 mg via INTRAVENOUS

## 2017-07-14 MED ORDER — ROCURONIUM BROMIDE 10 MG/ML (PF) SYRINGE
PREFILLED_SYRINGE | INTRAVENOUS | Status: AC
  Filled 2017-07-14: qty 5

## 2017-07-14 MED ORDER — BISACODYL 10 MG RE SUPP
10.0000 mg | Freq: Every day | RECTAL | Status: DC | PRN
Start: 2017-07-14 — End: 2017-07-15

## 2017-07-14 MED ORDER — IODIXANOL 320 MG/ML IV SOLN
INTRAVENOUS | Status: DC | PRN
  Administered 2017-07-14: 80.6 mL via INTRAVENOUS

## 2017-07-14 MED ORDER — HEPARIN SODIUM (PORCINE) 1000 UNIT/ML IJ SOLN
INTRAMUSCULAR | Status: DC | PRN
  Administered 2017-07-14: 3000 [IU] via INTRAVENOUS
  Administered 2017-07-14: 8000 [IU] via INTRAVENOUS

## 2017-07-14 MED ORDER — LORATADINE 10 MG PO TABS
10.0000 mg | ORAL_TABLET | Freq: Every day | ORAL | Status: DC
  Administered 2017-07-14 – 2017-07-15 (×2): 10 mg via ORAL
  Filled 2017-07-14 (×2): qty 1

## 2017-07-14 MED ORDER — PROTAMINE SULFATE 10 MG/ML IV SOLN
INTRAVENOUS | Status: AC
  Filled 2017-07-14: qty 5

## 2017-07-14 MED ORDER — HYDRALAZINE HCL 20 MG/ML IJ SOLN: 5.0000 mg | INTRAMUSCULAR | Status: DC | PRN

## 2017-07-14 MED ORDER — POTASSIUM CHLORIDE CRYS ER 20 MEQ PO TBCR: 20.0000 meq | EXTENDED_RELEASE_TABLET | Freq: Every day | ORAL | Status: DC | PRN

## 2017-07-14 MED ORDER — PROMETHAZINE HCL 25 MG/ML IJ SOLN: 6.2500 mg | INTRAMUSCULAR | Status: DC | PRN

## 2017-07-14 MED ORDER — FENTANYL CITRATE (PF) 250 MCG/5ML IJ SOLN
INTRAMUSCULAR | Status: AC
  Filled 2017-07-14: qty 5

## 2017-07-14 MED ORDER — PHENOL 1.4 % MT LIQD: 1.0000 | OROMUCOSAL | Status: DC | PRN

## 2017-07-14 MED ORDER — MIDAZOLAM HCL 2 MG/2ML IJ SOLN
INTRAMUSCULAR | Status: AC
  Filled 2017-07-14: qty 2

## 2017-07-14 MED ORDER — MAGNESIUM SULFATE 2 GM/50ML IV SOLN: 2.0000 g | Freq: Every day | INTRAVENOUS | Status: DC | PRN

## 2017-07-14 MED ORDER — DEXAMETHASONE SODIUM PHOSPHATE 10 MG/ML IJ SOLN
INTRAMUSCULAR | Status: DC | PRN
  Administered 2017-07-14: 10 mg via INTRAVENOUS

## 2017-07-14 MED ORDER — DOCUSATE SODIUM 100 MG PO CAPS
100.0000 mg | ORAL_CAPSULE | Freq: Every day | ORAL | Status: DC
  Administered 2017-07-15: 100 mg via ORAL
  Filled 2017-07-14: qty 1

## 2017-07-14 MED ORDER — SODIUM CHLORIDE 0.9 % IV SOLN: 500.0000 mL | Freq: Once | INTRAVENOUS | Status: DC | PRN

## 2017-07-14 SURGICAL SUPPLY — 65 items
BLADE CLIPPER SURG (BLADE) ×3 IMPLANT
Boston Scientific Interlock 8mm x 20cm (coil) (Coils) ×3 IMPLANT
CANISTER SUCT 3000ML PPV (MISCELLANEOUS) ×3 IMPLANT
CATH ANGIO 5F BER2 65CM (CATHETERS) ×3 IMPLANT
CATH BEACON 5.038 65CM KMP-01 (CATHETERS) IMPLANT
CATH OMNI FLUSH .035X70CM (CATHETERS) ×3 IMPLANT
CATH RENEGADE STC 130X20 (CATHETERS) ×1 IMPLANT
CATHETER RENEGADE STC 130X20 (CATHETERS) ×3
COIL IDC 2D .035 12MMX20CM (Vascular Products) ×3 IMPLANT
COVER PROBE W GEL 5X96 (DRAPES) IMPLANT
DERMABOND ADVANCED (GAUZE/BANDAGES/DRESSINGS) ×4
DERMABOND ADVANCED .7 DNX12 (GAUZE/BANDAGES/DRESSINGS) ×2 IMPLANT
DEVICE CLOSURE PERCLS PRGLD 6F (VASCULAR PRODUCTS) ×4 IMPLANT
DRAPE ZERO GRAVITY STERILE (DRAPES) ×3 IMPLANT
DRSG TEGADERM 2-3/8X2-3/4 SM (GAUZE/BANDAGES/DRESSINGS) ×6 IMPLANT
DRYSEAL FLEXSHEATH 12FR 33CM (SHEATH) ×2
DRYSEAL FLEXSHEATH 16FR 33CM (SHEATH) ×2
ELECT CAUTERY BLADE 6.4 (BLADE) IMPLANT
ELECT REM PT RETURN 9FT ADLT (ELECTROSURGICAL) ×3
ELECTRODE REM PT RTRN 9FT ADLT (ELECTROSURGICAL) ×1 IMPLANT
EXCLUDER TNK LEG 26MX12X18 (Endovascular Graft) ×1 IMPLANT
EXCLUDER TRUNK LEG 26MX12X18 (Endovascular Graft) ×3 IMPLANT
EXTENDER ENDOPROSTHESIS 10X7 (Endovascular Graft) ×3 IMPLANT
GAUZE SPONGE 2X2 8PLY STRL LF (GAUZE/BANDAGES/DRESSINGS) ×2 IMPLANT
GLOVE BIOGEL PI IND STRL 7.5 (GLOVE) ×1 IMPLANT
GLOVE BIOGEL PI INDICATOR 7.5 (GLOVE) ×2
GLOVE SURG SS PI 7.5 STRL IVOR (GLOVE) ×3 IMPLANT
GOWN STRL REUS W/ TWL LRG LVL3 (GOWN DISPOSABLE) ×4 IMPLANT
GOWN STRL REUS W/ TWL XL LVL3 (GOWN DISPOSABLE) ×1 IMPLANT
GOWN STRL REUS W/TWL LRG LVL3 (GOWN DISPOSABLE) ×8
GOWN STRL REUS W/TWL XL LVL3 (GOWN DISPOSABLE) ×2
GRAFT BALLN CATH 65CM (STENTS) ×1 IMPLANT
HEMOSTAT SNOW SURGICEL 2X4 (HEMOSTASIS) IMPLANT
KIT BASIN OR (CUSTOM PROCEDURE TRAY) ×3 IMPLANT
KIT ROOM TURNOVER OR (KITS) ×3 IMPLANT
LEG CONTRALATERAL 16X12X12 (Vascular Products) ×3 IMPLANT
NEEDLE PERC 18GX7CM (NEEDLE) ×3 IMPLANT
NS IRRIG 1000ML POUR BTL (IV SOLUTION) ×3 IMPLANT
PACK ENDOVASCULAR (PACKS) ×3 IMPLANT
PAD ARMBOARD 7.5X6 YLW CONV (MISCELLANEOUS) ×6 IMPLANT
PENCIL BUTTON HOLSTER BLD 10FT (ELECTRODE) IMPLANT
PERCLOSE PROGLIDE 6F (VASCULAR PRODUCTS) ×12
SHEATH AVANTI 11CM 8FR (MISCELLANEOUS) ×3 IMPLANT
SHEATH BRITE TIP 8FR 23CM (MISCELLANEOUS) ×3 IMPLANT
SHEATH DRYSEAL FLEX 12FR 33CM (SHEATH) ×1 IMPLANT
SHEATH DRYSEAL FLEX 16FR 33CM (SHEATH) ×1 IMPLANT
SHEATH FLEXOR ANSEL 1 7F 45CM (SHEATH) ×3 IMPLANT
SHIELD RADPAD SCOOP 12X17 (MISCELLANEOUS) ×6 IMPLANT
SLEEVE SURGEON STRL (DRAPES) ×3 IMPLANT
SPONGE GAUZE 2X2 STER 10/PKG (GAUZE/BANDAGES/DRESSINGS) ×4
STENT GRAFT BALLN CATH 65CM (STENTS) ×2
STOPCOCK MORSE 400PSI 3WAY (MISCELLANEOUS) ×3 IMPLANT
SUT ETHILON 3 0 PS 1 (SUTURE) IMPLANT
SUT PROLENE 5 0 C 1 24 (SUTURE) IMPLANT
SUT VIC AB 2-0 CT1 27 (SUTURE)
SUT VIC AB 2-0 CT1 TAPERPNT 27 (SUTURE) IMPLANT
SUT VIC AB 3-0 SH 27 (SUTURE)
SUT VIC AB 3-0 SH 27X BRD (SUTURE) IMPLANT
SUT VICRYL 4-0 PS2 18IN ABS (SUTURE) ×6 IMPLANT
SYR 30ML LL (SYRINGE) IMPLANT
TOWEL GREEN STERILE (TOWEL DISPOSABLE) ×9 IMPLANT
TRAY FOLEY MTR SLVR 16FR STAT (CATHETERS) ×3 IMPLANT
TUBING HIGH PRESSURE 120CM (CONNECTOR) ×6 IMPLANT
WIRE AMPLATZ SS-J .035X180CM (WIRE) ×6 IMPLANT
WIRE BENTSON .035X145CM (WIRE) ×9 IMPLANT

## 2017-07-14 NOTE — Transfer of Care (Signed)
Immediate Anesthesia Transfer of Care Note  Patient: Kevin Blake  Procedure(s) Performed: ABDOMINAL AORTIC ENDOVASCULAR STENT GRAFT, INSERTION WITH RIGHT ILIAC BRANCH DEVICE (N/A Groin)  Patient Location: PACU  Anesthesia Type:General  Level of Consciousness: awake, alert  and oriented  Airway & Oxygen Therapy: Patient Spontanous Breathing and Patient connected to nasal cannula oxygen  Post-op Assessment: Report given to RN, Post -op Vital signs reviewed and stable and Patient moving all extremities  Post vital signs: Reviewed and stable  Last Vitals:  Vitals:   07/14/17 0651  BP: (!) 161/87  Pulse: (!) 59  Temp: 36.7 C  SpO2: 97%    Last Pain:  Vitals:   07/14/17 0651  TempSrc: Oral      Patients Stated Pain Goal: 3 (68/11/57 2620)  Complications: No apparent anesthesia complications pt remains flat for 4 hours per Dr. Trula Slade

## 2017-07-14 NOTE — Interval H&P Note (Signed)
History and Physical Interval Note:  07/14/2017 7:18 AM  Kevin Blake  has presented today for surgery, with the diagnosis of ABDOMINAL AORTIC ANUERYSM  The various methods of treatment have been discussed with the patient and family. After consideration of risks, benefits and other options for treatment, the patient has consented to  Procedure(s): ABDOMINAL AORTIC ENDOVASCULAR STENT GRAFT, INSERTION WITH [place] (N/A) as a surgical intervention .  The patient's history has been reviewed, patient examined, no change in status, stable for surgery.  I have reviewed the patient's chart and labs.  Questions were answered to the patient's satisfaction.     Annamarie Major

## 2017-07-14 NOTE — Progress Notes (Signed)
Patient admitted from PACU. Alert and oriented. Vitals stable. Denies pain. Vitals stable.

## 2017-07-14 NOTE — Discharge Instructions (Signed)
° °[facility] [place]  ° °Discharge Instructions ° °Endovascular Aortic Aneurysm Repair ° °Please refer to the following instructions for your post-procedure care. Your surgeon or Physician Assistant will discuss any changes with you. ° °Activity ° °You are encouraged to walk as much as you can. You can slowly return to normal activities but must avoid strenuous activity and heavy lifting until your doctor tells you it's OK. Avoid activities such as vacuuming or swinging a gold club. It is normal to feel tired for several weeks after your surgery. Do not drive until your doctor gives the OK and you are no longer taking prescription pain medications. It is also normal to have difficulty with sleep habits, eating, and bowel movements after surgery. These will go away with time. ° °Bathing/Showering ° °You may shower after you go home. If you have an incision, do not soak in a bathtub, hot tub, or swim until the incision heals completely. ° °If you have incisions in your groin, sash the groin wounds with soap and water daily and pat dry. (No tub bath-only shower)  Then put a dry gauze or washcloth there to keep this area dry to help prevent wound infection daily and as needed.  Do not use Vaseline or neosporin on your incisions.  Only use soap and water on your incisions and then protect and keep dry. ° °Incision Care ° °Shower every day. Clean your incision with mild soap and water. Pat the area dry with a clean towel. You do not need a bandage unless otherwise instructed. Do not apply any ointments or creams to your incision. If you clothing is irritating, you may cover your incision with a dry gauze pad. ° °Diet ° °Resume your normal diet. There are no special food restrictions following this procedure. A low fat/low cholesterol diet is recommended for all patients with vascular disease. In order to heal from your surgery, it is CRITICAL to get adequate nutrition. Your body requires  vitamins, minerals, and protein. Vegetables are the best source of vitamins and minerals. Vegetables also provide the perfect balance of protein. Processed food has little nutritional value, so try to avoid this. ° °Medications ° °Resume taking all of your medications unless your doctor or nurse practitioner tells you not to. If your incision is causing pain, you may take over-the-counter pain relievers such as acetaminophen (Tylenol). If you were prescribed a stronger pain medication, please be aware these medications can cause nausea and constipation. Prevent nausea by taking the medication with a snack or meal. Avoid constipation by drinking plenty of fluids and eating foods with a high amount of fiber, such as fruits, vegetables, and grains. Do not take Tylenol if you are taking prescription pain medications. ° ° °Follow up ° °Our office will schedule a follow-up appointment with a C.T. scan 3-4 weeks after your surgery. ° °Please call us immediately for any of the following conditions ° °Severe or worsening pain in your legs or feet or in your abdomen back or chest. °Increased pain, redness, drainage (pus) from your incision sit. °Increased abdominal pain, bloating, nausea, vomiting or persistent diarrhea. °Fever of 101 degrees or higher. °Swelling in your leg (s), ° °Reduce your risk of vascular disease ° °•Stop smoking. If you would like help call [facility] at 1-800-QUIT-NOW (1-800-784-8669) or [place] at 336-586-4000. °•Manage your cholesterol °•Maintain a desired weight °•Control your diabetes °•Keep your blood pressure down ° °If you have questions, please call the office at [phone]. ° °

## 2017-07-14 NOTE — Op Note (Signed)
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  Ipsilateral right extension was a Gouru 12 x 7.  Contralateral left was 12 x 12.  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  He now meets size criteria for repair.  We discussed the possibility of a iliac branch device, however ultimately he was not a candidate and therefore will require embolization of his right hypogastric artery.  I discussed the possibility of buttock claudication with the patient he understands this and wishes to proceed.  Procedure:  The patient was identified in the holding area and taken to [place] [facility]  The patient was then placed supine on the table. general anesthesia was administered.  The patient was prepped and draped in the usual sterile fashion.  A time out was called and antibiotics were administered.  Ultrasound was used to evaluate bilateral common femoral arteries which are widely patent with minimal calcification.  Bilateral common femoral arteries were cannulated under ultrasound guidance with an 18-gauge needle.  035 wires were advanced into the aorta.  An 8 [place] dilator was used to dilate the  subcutaneous tissue.  Pro-glide devices were deployed at the 1:00 and 11 o'clock position for pre-closure.  An 8 French sheath was placed on the right side and a 7 French 45 cm sheath was placed on the left.  The patient was fully heparinized.  Next using an Omni Flush catheter and a Bentson wire, the wire was advanced into the right external iliac artery.  I then advanced the 7 French sheath over the bifurcation and into the right external iliac artery.  A Berenstein 2 catheter was then used to select the right hypogastric artery.  Contrast injections were performed to identify the main trunk of the hypogastric artery as well as the 2 branches.  I then deployed first a 12 x 30 detachable renegade coil into the main trunk of the right hypogastric artery.  Follow-up imaging suggested that a second coil should be placed.  This was done using an 8 x 20 detachable coil.  Next angiography was performed with the sheath in the right hypogastric artery which showed successful embolization of the main trunk of the right hypogastric artery.  Next, the 7 French sheath was withdrawn into the left common iliac artery and a Bentson wire was advanced into the aorta.  A pigtail catheter was then placed to the level of L1.  Using a Berenstein 2 catheter from the right, an Amplatz Super Stiff wire was placed into the descending thoracic aorta.  The 8 French sheath was removed and a 45 French sheath was placed into the aorta.  The main body device was prepared on the back table and advanced  up the right side.  This was a Gore 26 x 12 x 18 device.  An abdominal aortogram was then performed which located the renal arteries.  The main body device was then deployed just below the renal arteries down to the contralateral gate.  Next, the contralateral gate was cannulated with an Omni flush catheter.  This was able to be freely rotated within the main body, confirming successful cannulation.  An Amplatz Super Stiff wire was then placed.   The image detector was rotated to a right anterior oblique position in a retrograde injection was performed through the sheath locating the left hypogastric artery.  The 8 French sheath was then removed and a 12 [place] sheath was advanced into the contralateral gate.  The contralateral limb was prepared on the back table and inserted.  This was a 12 x 12 device.  It was then deployed landing just proximal to the left hypogastric artery.  Next, the remaining portion of the ipsilateral limb was deployed.  The delivery system was removed.  A retrograde sheath injection was performed.  I felt a right-sided extension would be required for adequate coverage of the common iliac aneurysm.  A 10 x 7 device was then prepared and inserted.  It was deployed approximately 3 cm distal to the hypogastric artery origin.  Next, a.m. of the balloon was used to mold the device attachment sites as well as device overlap.  A completion arteriogram was then performed which showed successful exclusion of the aneurysms with no evidence of a type I or III endoleak.  The left hypogastric artery remained widely patent as did bilateral renal arteries.  I was satisfied with these results.  Bentson wires were placed and exchanged for the Amplatz superstiff wires.  The sheaths were then removed and the probe glide devices were secured for arteriotomy closure.  50 mg of protamine was given.  Manual pressure was held for approximately 5 minutes.  Both groins were hemostatic.  Cautery was used to reapproximate the subtendinous tissue.  Dermabond was applied.  The patient had breast pedal Doppler signals after the case.  He was successfully extubated and taken to the recovery room in stable condition.  There were no immediate complications.   Disposition: To PACU stable   V. Annamarie Major, M.D. [facility] [place] Office: [phone] Pager:  [phone]

## 2017-07-14 NOTE — Progress Notes (Signed)
  Day of Surgery Note    Subjective:  Has a little soreness, otherwise, no complaints   Vitals:   07/14/17 1145 07/14/17 1200  BP: (!) 93/59 96/60  Pulse: 69 (!) 56  Resp: (!) 9 11  Temp:    SpO2: 98% 98%    Incisions:   Bilateral groins are soft without hematoma Extremities:  Easily palpable DP/PT pulses bilaterally Cardiac:  regular Lungs:  Non labored Abdomen:  Soft, NT/ND   Assessment/Plan:  This is a 72 y.o. male who is s/p  EVAR   -pt doing well in recovery room with easily palpable pedal pulses bilaterally -to 4 east when bed available.  -anticipate discharge home tomorrow if this evening is uneventful -holding Cozaar today given the contrast he received.    Leontine Locket, PA-C 07/14/2017 12:21 PM [phone]

## 2017-07-14 NOTE — Anesthesia Procedure Notes (Signed)
Procedure Name: Intubation Date/Time: 07/14/2017 8:32 AM Performed by: Leonor Liv, CRNA Pre-anesthesia Checklist: Patient identified, Emergency Drugs available, Suction available and Patient being monitored Patient Re-evaluated:Patient Re-evaluated prior to induction Oxygen Delivery Method: Circle System Utilized Preoxygenation: Pre-oxygenation with 100% oxygen Induction Type: IV induction Ventilation: Mask ventilation without difficulty Laryngoscope Size: Mac and 4 Grade View: Grade I Tube type: Oral Tube size: 7.5 mm Number of attempts: 1 Airway Equipment and Method: Stylet and Oral airway Placement Confirmation: ETT inserted through vocal cords under direct vision,  positive ETCO2 and breath sounds checked- equal and bilateral Secured at: 22 cm Tube secured with: Tape Dental Injury: Teeth and Oropharynx as per pre-operative assessment

## 2017-07-14 NOTE — Anesthesia Procedure Notes (Signed)
Arterial Line Insertion Performed by: Leonor Liv, CRNA, CRNA  Preanesthetic checklist: patient identified, IV checked, site marked, risks and benefits discussed, surgical consent, monitors and equipment checked, pre-op evaluation and timeout performed Lidocaine 1% used for infiltration Right, radial was placed Catheter size: 20 G Hand hygiene performed  and maximum sterile barriers used  Allen's test indicative of satisfactory collateral circulation Attempts: 1 Procedure performed without using ultrasound guided technique. Following insertion, Biopatch and dressing applied. Post procedure assessment: normal  Patient tolerated the procedure well with no immediate complications.

## 2017-07-14 NOTE — Anesthesia Postprocedure Evaluation (Signed)
Anesthesia Post Note  Patient: NAKEEM MURNANE  Procedure(s) Performed: ABDOMINAL AORTIC ENDOVASCULAR STENT GRAFT, INSERTION WITH RIGHT ILIAC BRANCH DEVICE (N/A Groin)     Patient location during evaluation: PACU Anesthesia Type: General Level of consciousness: awake and alert, oriented and patient cooperative Pain management: pain level controlled Vital Signs Assessment: post-procedure vital signs reviewed and stable Respiratory status: spontaneous breathing, nonlabored ventilation, respiratory function stable and patient connected to nasal cannula oxygen Cardiovascular status: blood pressure returned to baseline and stable Postop Assessment: no apparent nausea or vomiting Anesthetic complications: no    Last Vitals:  Vitals:   07/14/17 1245 07/14/17 1300  BP: 98/65 109/68  Pulse: 60 60  Resp: (!) 9 12  Temp:  36.5 C  SpO2: 95% 93%    Last Pain:  Vitals:   07/14/17 1230  TempSrc:   PainSc: 0-No pain                 Zoey Gilkeson,E. Chael Urenda

## 2017-07-14 NOTE — Addendum Note (Signed)
Addendum  created 07/14/17 1351 by Leonor Liv, CRNA   Intraprocedure Meds edited

## 2017-07-15 ENCOUNTER — Encounter (HOSPITAL_COMMUNITY): Payer: Self-pay

## 2017-07-15 ENCOUNTER — Other Ambulatory Visit: Payer: Self-pay

## 2017-07-15 LAB — CBC
HEMATOCRIT: 40.2 % (ref 39.0–52.0)
HEMOGLOBIN: 13.6 g/dL (ref 13.0–17.0)
MCH: 30.2 pg (ref 26.0–34.0)
MCHC: 33.8 g/dL (ref 30.0–36.0)
MCV: 89.3 fL (ref 78.0–100.0)
Platelets: 169 10*3/uL (ref 150–400)
RBC: 4.5 MIL/uL (ref 4.22–5.81)
RDW: 12.7 % (ref 11.5–15.5)
WBC: 14.1 10*3/uL — AB (ref 4.0–10.5)

## 2017-07-15 LAB — BASIC METABOLIC PANEL
ANION GAP: 12 (ref 5–15)
BUN: 10 mg/dL (ref 6–20)
CHLORIDE: 105 mmol/L (ref 101–111)
CO2: 22 mmol/L (ref 22–32)
Calcium: 8.7 mg/dL — ABNORMAL LOW (ref 8.9–10.3)
Creatinine, Ser: 0.91 mg/dL (ref 0.61–1.24)
GFR calc Af Amer: >60 mL/min (ref >60)
GFR calc non Af Amer: >60 mL/min (ref >60)
GLUCOSE: 104 mg/dL — AB (ref 65–99)
POTASSIUM: 3.7 mmol/L (ref 3.5–5.1)
Sodium: 139 mmol/L (ref 135–145)

## 2017-07-15 MED ORDER — OXYCODONE-ACETAMINOPHEN 5-325 MG PO TABS: 1.0000 | ORAL_TABLET | Freq: Four times a day (QID) | ORAL | 0 refills | Status: DC | PRN

## 2017-07-15 NOTE — Care Management Note (Signed)
Case Management Note Marvetta Gibbons RN, BSN Unit 4E-Case Manager [phone]  Patient Details  Name: Kevin Blake MRN: [id number] Date of Birth: 1945-10-19  Subjective/Objective:   Pt admitted s/p AAA repair                 Action/Plan: PTA pt lived at home with spouse- notified by Jonelle Sidle with [facility] that pt has pre-op referral to them for [facility] services- they will f/u with pt post discharge- No further CM needs noted for transition home.   Expected Discharge Date:  07/15/17               Expected Discharge Plan:  [place]  In-House Referral:  NA  Discharge planning Services  CM Consult  Post Acute Care Choice:  Home Health Choice offered to:  NA  DME Arranged:    DME Agency:     HH Arranged:    HH Agency:  [facility]  Status of Service:  Completed, signed off  If discussed at [place] of Stay Meetings, dates discussed:    Discharge Disposition: home/home health   Additional Comments:  Dawayne Patricia, RN 07/15/2017, 10:50 AM

## 2017-07-15 NOTE — Discharge Summary (Signed)
EVAR Discharge Summary   Kevin Blake July 05, 1945 72 y.o. male  MRN: [id number]  Admission Date: 07/14/2017  Discharge Date: 07/15/17  Physician: Serafina Mitchell, MD  Admission Diagnosis: ABDOMINAL AORTIC ANUERYSM  Discharge Day services:    Denies abd or back pain; mild pain in B groin cath sites; patient is feeling fit for discharge home. Physical Exam: Vitals:   07/14/17 2346 07/15/17 0406  BP: 112/69   Pulse: 74   Resp:    Temp: 98.1 F (36.7 C) 97.7 F (36.5 C)  SpO2:     General: NAD Cardiac:  RRR Lungs:  Non labored Extremities:  B groin cath sites without palpable hematoma or pseudoaneurysm' palpable and symmetrical DP pulses Abdomen:  Soft Neurologic: A&O  Hospital Course:  The patient was admitted to the hospital and taken to the operating room on 07/14/2017 and underwent: Endovascular repair of infrarenal and R CIA aneurysm with embolization of R hypogastric artery.  He tolerated the procedure well and was admitted to the hospital to monitor circulatory status.      POD #1 patient is ambulating without difficulty, tolerating a home diet, and feeling fit for discharge home.  Morning labs do not demonstrate any significant changes in renal function.  The remainder of the hospital course consisted of increasing mobilization and increasing intake of solids without difficulty.  He will be prescribed 2 days of narcotic pain medication.  He will follow up in office in about 4 weeks with CTA abd/pelvis.  Discharge instructions were reviewed with the patient and he voices his understanding.  He will be discharged this morning in stable condition.  CBC    Component Value Date/Time   WBC 14.1 (H) 07/15/2017 0221   RBC 4.50 07/15/2017 0221   HGB 13.6 07/15/2017 0221   HCT 40.2 07/15/2017 0221   PLT 169 07/15/2017 0221   MCV 89.3 07/15/2017 0221   MCH 30.2 07/15/2017 0221   MCHC 33.8 07/15/2017 0221   RDW 12.7 07/15/2017 0221    BMET    Component Value  Date/Time   NA 139 07/15/2017 0221   K 3.7 07/15/2017 0221   CL 105 07/15/2017 0221   CO2 22 07/15/2017 0221   GLUCOSE 104 (H) 07/15/2017 0221   BUN 10 07/15/2017 0221   CREATININE 0.91 07/15/2017 0221   CALCIUM 8.7 (L) 07/15/2017 0221   GFRNONAA >60 07/15/2017 0221   GFRAA >60 07/15/2017 0221         Discharge Diagnosis:  ABDOMINAL AORTIC ANUERYSM  Secondary Diagnosis: Patient Active Problem List   Diagnosis Date Noted  . AAA (abdominal aortic aneurysm) ([place]) 07/14/2017  . Aneurysm of iliac artery ([facility]) 03/27/2013  . Ventral hernia 01/06/2013  . CAD (coronary artery disease)   . HLD (hyperlipidemia)   . HTN (hypertension)    Past Medical History:  Diagnosis Date  . AAA (abdominal aortic aneurysm) ([place])   . Arthritis   . Asthma    as a child  . CAD (coronary artery disease)    s/p angioplasty  . Chronic abdominal pain    RUQ  . Colon polyps   . Diverticulosis   . GERD (gastroesophageal reflux disease)   . HLD (hyperlipidemia)   . HTN (hypertension)   . IBS (irritable bowel syndrome)   . Skin cancer      Allergies as of 07/15/2017      Reactions   Lipitor [atorvastatin]    gen muscle pain   Lactose Intolerance (gi)    Bloating and  gas      Medication List    TAKE these medications   aspirin 81 MG tablet Take 81 mg by mouth daily.   aspirin EC 325 MG tablet Take 325 mg by mouth daily as needed for moderate pain.   Coenzyme Q10 200 MG capsule Take 200 mg by mouth daily.   fluticasone 50 MCG/ACT nasal spray Commonly known as:  FLONASE Place 1 spray into both nostrils daily as needed for allergies or rhinitis.   loratadine 10 MG tablet Commonly known as:  CLARITIN Take 10 mg by mouth daily.   losartan 100 MG tablet Commonly known as:  COZAAR Take 1 tablet (100 mg total) by mouth daily. What changed:  how much to take   nitroGLYCERIN 0.4 MG SL tablet Commonly known as:  NITROSTAT Place 1 tablet (0.4 mg total) under the tongue every 5  (five) minutes as needed. What changed:  reasons to take this   OVER THE COUNTER MEDICATION Take 1 capsule by mouth daily as needed (gas). IBgard otc stomach relief med   oxyCODONE-acetaminophen 5-325 MG tablet Commonly known as:  PERCOCET/ROXICET Take 1-2 tablets by mouth every 6 (six) hours as needed for moderate pain.   ranitidine 300 MG tablet Commonly known as:  ZANTAC Take 300 mg by mouth daily as needed for heartburn.   rosuvastatin 20 MG tablet Commonly known as:  CRESTOR TAKE 1 TABLET ONCE DAILY.       Discharge Instructions:   [facility]  Discharge Instructions Endovascular Aortic Aneurysm Repair  Please refer to the following instructions for your post-procedure care. Your surgeon or Physician Assistant will discuss any changes with you.  Activity  You are encouraged to walk as much as you can. You can slowly return to normal activities but must avoid strenuous activity and heavy lifting until your doctor tells you it's OK. Avoid activities such as vacuuming or swinging a gold club. It is normal to feel tired for several weeks after your surgery. Do not drive until your doctor gives the OK and you are no longer taking prescription pain medications. It is also normal to have difficulty with sleep habits, eating, and bowel movements after surgery. These will go away with time.  Bathing/Showering  You may shower after you go home. If you have an incision, do not soak in a bathtub, hot tub, or swim until the incision heals completely.  Incision Care  Shower every day. Clean your incision with mild soap and water. Pat the area dry with a clean towel. You do not need a bandage unless otherwise instructed. Do not apply any ointments or creams to your incision. If you clothing is irritating, you may cover your incision with a dry gauze pad.  Diet  Resume your normal diet. There are no special food restrictions following this procedure. A  low fat/low cholesterol diet is recommended for all patients with vascular disease. In order to heal from your surgery, it is CRITICAL to get adequate nutrition. Your body requires vitamins, minerals, and protein. Vegetables are the best source of vitamins and minerals. Vegetables also provide the perfect balance of protein. Processed food has little nutritional value, so try to avoid this.  Medications  Resume taking all of your medications unless your doctor or Physician Assistnat tells you not to. If your incision is causing pain, you may take over-the-counter pain relievers such as acetaminophen (Tylenol). If you were prescribed a stronger pain medication, please be aware these medications can  cause nausea and constipation. Prevent nausea by taking the medication with a snack or meal. Avoid constipation by drinking plenty of fluids and eating foods with a high amount of fiber, such as fruits, vegetables, and grains. Do not take Tylenol if you are taking prescription pain medications.   Follow up  [place] office will schedule a follow-up appointment with a C.T. scan 3-4 weeks after your surgery.  Please call us immediately for any of the following conditions  Severe or worsening pain in your legs or feet or in your abdomen back or chest. Increased pain, redness, drainage (pus) from your incision sit. Increased abdominal pain, bloating, nausea, vomiting or persistent diarrhea. Fever of 101 degrees or higher. Swelling in your leg (s),  Reduce your risk of vascular disease  .Stop smoking. If you would like help call [facility] at 1-800-QUIT-NOW 772-789-0275) or [place] at [phone]. .Manage your cholesterol .Maintain a desired weight .Control your diabetes .Keep your blood pressure down  If you have questions, please call the office at [phone].   Disposition: home  Patient's condition: is Good  Follow up: 1. Dr. Trula Slade in 4 weeks with CTA protocol   Dagoberto Ligas,  PA-C [facility] [phone] 07/15/2017  7:32 AM   - For VQI Registry use - Post-op:  Time to Extubation: [x]  In OR, [ ]  < 12 hrs, [ ]  12-24 hrs, [ ]  >=24 hrs Vasopressors Req. Post-op: No MI: No., [ ]  Troponin only, [ ]  EKG or Clinical New Arrhythmia: No CHF: No ICU Stay: no Transfusion: No     If yes,  units given  Complications: Resp failure: No., [ ]  Pneumonia, [ ]  Ventilator Chg in renal function: No., [ ]  Inc. Cr > 0.5, [ ]  Temp. Dialysis,  [ ]  Permanent dialysis Leg ischemia: No., no Surgery needed, [ ]  Yes, Surgery needed,  [ ]  Amputation Bowel ischemia: No., [ ]  Medical Rx, [ ]  Surgical Rx Wound complication: No., [ ]  Superficial separation/infection, [ ]  Return to OR Return to OR: No  Return to OR for bleeding: No Stroke: No., [ ]  Minor, [ ]  Major  Discharge medications: Statin use:  No due to allergy  ASA use:  Yes  Plavix use:  No  Beta blocker use:  No  ARB use:  Yes ACEI use:  No CCB use:  No  Dagoberto Ligas, PA-C [facility] [phone] 07/15/2017  7:44 AM

## 2017-07-15 NOTE — Progress Notes (Signed)
Discharged at 11:45am with spouse.  Discharge summary and medications reviewed with pt and signed summary.  IV saline locks removed bilateral forearms w/o difficulty.  Telemetry box removed.  Pt left with all belongings and ambulated off unit per request with spouse.

## 2017-07-16 ENCOUNTER — Other Ambulatory Visit: Payer: Self-pay

## 2017-07-16 DIAGNOSIS — I714 Abdominal aortic aneurysm, without rupture, unspecified: Secondary | ICD-10-CM

## 2017-07-19 ENCOUNTER — Telehealth: Payer: Self-pay | Admitting: Surgery

## 2017-07-19 NOTE — Telephone Encounter (Signed)
Sched appts 08/16/17. CTA at 12:40 at [place] and MD at 2:00. Lm on cell# to inform pt of appts and CTA instructions.

## 2017-07-19 NOTE — Telephone Encounter (Signed)
-----   Message from Mena Goes, RN sent at 07/14/2017 10:33 AM EST ----- Regarding: 4 weeks w/ CTA  A/P post EVAR   ----- Message ----- From: Gabriel Earing, PA-C Sent: 07/14/2017  10:30 AM To: Vvs Charge Pool  S/p EVAR 07/14/17.  F/u with Dr. Trula Slade in 4 weeks with CTA protocol.  Thanks

## 2017-07-20 ENCOUNTER — Encounter (HOSPITAL_COMMUNITY): Payer: Self-pay | Admitting: Surgery

## 2017-08-05 NOTE — Progress Notes (Signed)
HPI:  72 y.o. f/u PVD and CAD. Distant stent to RCA in 1997.  Normal stress test 2009.  CRF;s HTN and HLD Sees Dr Trula Slade for vascular disease. 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 AAA only 3.6 cm.  Has chronic abdominal pain with IBS.  Good recovery no hematoma BP seems to be running higher   ROS: Denies fever, malais, weight loss, blurry vision, decreased visual acuity, cough, sputum, SOB, hemoptysis, pleuritic pain, palpitaitons, heartburn, abdominal pain, melena, lower extremity edema, claudication, or rash.  All other systems reviewed and negative  BP (!) 164/86 Comment: states bp is usually good  Pulse 60   Ht 5\' 8"  (1.727 m)   Wt 173 lb 12 oz (78.8 kg)   BMI 26.42 kg/m  Affect appropriate Healthy:  appears stated age 82: normal Neck supple with no adenopathy JVP normal no bruits no thyromegaly Lungs clear with no wheezing and good diaphragmatic motion Heart:  S1/S2 no murmur, no rub, gallop or click PMI normal Abdomen: benighn, BS positve, no tenderness, no AAA no bruit.  No HSM or HJR Distal pulses intact with no bruits No edema Neuro non-focal Skin warm and dry No muscular weakness    Current Outpatient Medications  Medication Sig Dispense Refill  . aspirin 81 MG tablet Take 81 mg by mouth daily.      Marland Kitchen aspirin EC 325 MG tablet Take 325 mg by mouth daily as needed for moderate pain.    . Coenzyme Q10 200 MG capsule Take 200 mg by mouth daily.    . fluticasone (FLONASE) 50 MCG/ACT nasal spray Place 1 spray into both nostrils daily as needed for allergies or rhinitis.     Marland Kitchen loratadine (CLARITIN) 10 MG tablet Take 10 mg by mouth daily.    Marland Kitchen losartan (COZAAR) 100 MG tablet Take 50 mg by mouth daily.    . nitroGLYCERIN (NITROSTAT) 0.4 MG SL tablet Place 1 tablet (0.4 mg total) under the tongue every 5 (five) minutes as needed. (Patient taking  differently: Place 0.4 mg under the tongue every 5 (five) minutes as needed for chest pain. ) 25 tablet 4  . OVER THE COUNTER MEDICATION Take 1 capsule by mouth daily as needed (gas). IBgard otc stomach relief med    . oxyCODONE-acetaminophen (PERCOCET/ROXICET) 5-325 MG tablet Take 1-2 tablets by mouth every 6 (six) hours as needed for moderate pain. 8 tablet 0  . ranitidine (ZANTAC) 300 MG tablet Take 300 mg by mouth daily as needed for heartburn.    . rosuvastatin (CRESTOR) 20 MG tablet TAKE 1 TABLET ONCE DAILY. 90 tablet 1   No current facility-administered medications for this visit.     Allergies  Lipitor [atorvastatin] and Lactose intolerance (gi)  Electrocardiogram:  01/10/14  SR rate 58  RBBB   01/2015  SR  Rate 63  RBBB   Assessment and Plan  CAD: Distant RCA stent f/u ETT   02/19/15 was normal stable observe New nitro called in  HTN:   He seems to think it is ok at home will monitor told him to take whole Cozaar tablet If systolic running over 562 mmHg consistently   Iliac Aneurysm  Post endovascular repair 07/17/17 f/u [facility] for surveillance of AAA 3.6 cm   Chol:  Continue statin LDL 84 on 08/24/16   Lab Results  Component Value Date   LDLCALC 84 08/24/2016   IBS:  F/u GI no pathology identified in past          Jenkins Rouge

## 2017-08-10 ENCOUNTER — Ambulatory Visit: Payer: Medicare Other | Admitting: Cardiovascular Disease

## 2017-08-10 ENCOUNTER — Encounter: Payer: Self-pay | Admitting: Cardiovascular Disease

## 2017-08-10 VITALS — BP 164/86 | HR 60 | Ht 68.0 in | Wt 173.8 lb

## 2017-08-10 DIAGNOSIS — E7849 Other hyperlipidemia: Secondary | ICD-10-CM | POA: Diagnosis not present

## 2017-08-10 DIAGNOSIS — I2510 Atherosclerotic heart disease of native coronary artery without angina pectoris: Secondary | ICD-10-CM

## 2017-08-10 DIAGNOSIS — I723 Aneurysm of iliac artery: Secondary | ICD-10-CM

## 2017-08-10 NOTE — Patient Instructions (Addendum)

## 2017-08-16 ENCOUNTER — Ambulatory Visit
Admission: RE | Admit: 2017-08-16 | Discharge: 2017-08-16 | Disposition: A | Discharge status: Home / Self Care | Payer: Medicare Other | Source: Ambulatory Visit | Attending: Surgery | Admitting: Surgery

## 2017-08-16 ENCOUNTER — Ambulatory Visit (INDEPENDENT_AMBULATORY_CARE_PROVIDER_SITE_OTHER): Payer: Medicare Other | Admitting: Surgery

## 2017-08-16 ENCOUNTER — Encounter: Payer: Self-pay | Admitting: Surgery

## 2017-08-16 ENCOUNTER — Other Ambulatory Visit: Payer: Medicare Other

## 2017-08-16 VITALS — BP 126/73 | HR 83 | Temp 97.1°F | Resp 18 | Ht 68.0 in | Wt 176.0 lb

## 2017-08-16 DIAGNOSIS — I714 Abdominal aortic aneurysm, without rupture, unspecified: Secondary | ICD-10-CM

## 2017-08-16 MED ORDER — IOPAMIDOL (ISOVUE-370) INJECTION 76%
75.0000 mL | Freq: Once | INTRAVENOUS | Status: AC | PRN
  Administered 2017-08-16: 75 mL via INTRAVENOUS

## 2017-08-16 NOTE — Progress Notes (Signed)
Patient name: Kevin Blake MRN: [id number] DOB: 07-23-1945 Sex: male  REASON FOR VISIT:     post op  HISTORY OF PRESENT ILLNESS:   Kevin Blake is a 72 y.o. 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  His maximum aortic diameter was 4.0 cm.  The right common iliac aneurysm measured 3.3 cm.  His postoperative course was uncomplicated.  He had very minimal right buttock symptoms.  Whatever symptoms he had have completely resolved.  He is walking 2-3 miles per day  CURRENT MEDICATIONS:    Current Outpatient Medications  Medication Sig Dispense Refill  . aspirin 81 MG tablet Take 81 mg by mouth daily.      Marland Kitchen aspirin EC 325 MG tablet Take 325 mg by mouth daily as needed for moderate pain.    . Coenzyme Q10 200 MG capsule Take 200 mg by mouth daily.    . fluticasone (FLONASE) 50 MCG/ACT nasal spray Place 1 spray into both nostrils daily as needed for allergies or rhinitis.     Marland Kitchen loratadine (CLARITIN) 10 MG tablet Take 10 mg by mouth daily.    Marland Kitchen losartan (COZAAR) 100 MG tablet Take 50 mg by mouth daily.    . nitroGLYCERIN (NITROSTAT) 0.4 MG SL tablet Place 1 tablet (0.4 mg total) under the tongue every 5 (five) minutes as needed. (Patient taking differently: Place 0.4 mg under the tongue every 5 (five) minutes as needed for chest pain. ) 25 tablet 4  . OVER THE COUNTER MEDICATION Take 1 capsule by mouth daily as needed (gas). IBgard otc stomach relief med    . oxyCODONE-acetaminophen (PERCOCET/ROXICET) 5-325 MG tablet Take 1-2 tablets by mouth every 6 (six) hours as needed for moderate pain. 8 tablet 0  . ranitidine (ZANTAC) 300 MG tablet Take 300 mg by mouth daily as needed for heartburn.    . rosuvastatin (CRESTOR) 20 MG tablet TAKE 1 TABLET ONCE DAILY. 90 tablet 1   No current facility-administered medications for this  visit.     REVIEW OF SYSTEMS:   [X]  denotes positive finding, [ ]  denotes negative finding Cardiac  Comments:  Chest pain or chest pressure:    Shortness of breath upon exertion:    Short of breath when lying flat:    Irregular heart rhythm:    Constitutional    Fever or chills:      PHYSICAL EXAM:   Vitals:   08/16/17 1322  BP: 126/73  Pulse: 83  Resp: 18  Temp: (!) 97.1 F (36.2 C)  TempSrc: Oral  SpO2: 99%  Weight: 176 lb (79.8 kg)  Height: 5\' 8"  (1.727 m)    GENERAL: The patient is a well-nourished male, in no acute distress. The vital signs are documented above. CARDIOVASCULAR: There is a regular rate and rhythm. PULMONARY: Non-labored respirations Both groin incisions have completely healed.  Abdomen is soft nontender  STUDIES:   I have reviewed the CT scan with the following findings:  Interval surgical changes of endovascular repair of small infrarenal abdominal aortic aneurysm and right iliac artery aneurysm without complicating features. No endoleak identified at the aortic or iliac component.  Right iliac limb covers the hypogastric artery origin which has been embolized. Pelvic hypogastric artery branches are patent via collateral flow.  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  Irregular beaded appearance of the mid  segment of the right renal artery, potentially representing fibromuscular dysplasia.  Mild-to-moderate aortic atherosclerosis. Aortic Atherosclerosis (ICD10-I70.0).  Diverticular disease without evidence of acute diverticulitis.  MEDICAL ISSUES:   Status post endovascular aneurysm repair of an abdominal aortic aneurysm and a right common iliac aneurysm: No evidence of endoleak by CT scan.  Aneurysms are completely excluded.  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  He has had an excellent result.  He will follow-up  in 6 months with an abdominal ultrasound.    Annamarie Major, MD [facility] [phone] Pager [phone]

## 2017-08-17 ENCOUNTER — Other Ambulatory Visit: Payer: Self-pay

## 2017-08-17 DIAGNOSIS — I714 Abdominal aortic aneurysm, without rupture, unspecified: Secondary | ICD-10-CM

## 2017-09-09 ENCOUNTER — Other Ambulatory Visit: Payer: Self-pay | Admitting: Cardiovascular Disease

## 2017-09-09 MED ORDER — LOSARTAN POTASSIUM 100 MG PO TABS: 50.0000 mg | ORAL_TABLET | Freq: Every day | ORAL | 3 refills | Status: DC

## 2017-09-09 NOTE — Telephone Encounter (Signed)
Pt's medication was sent to pt's pharmacy as requested. Confirmation received.  °

## 2017-11-04 ENCOUNTER — Telehealth: Payer: Self-pay | Admitting: Cardiovascular Disease

## 2017-11-04 NOTE — Telephone Encounter (Signed)
Patient aware of results. Per Dr. [name], LDL 91 near goal work on diet and continue crestor and fish oil LFTls normal. Patient verbalized understanding.

## 2017-11-04 NOTE — Telephone Encounter (Signed)
New Message: ° ° ° ° ° °Pt is returning a call for labs ° °

## 2018-02-15 ENCOUNTER — Telehealth: Payer: Self-pay

## 2018-02-15 DIAGNOSIS — I10 Essential (primary) hypertension: Secondary | ICD-10-CM

## 2018-02-15 DIAGNOSIS — Z79899 Other long term (current) drug therapy: Secondary | ICD-10-CM

## 2018-02-15 MED ORDER — LOSARTAN POTASSIUM 100 MG PO TABS: 100.0000 mg | ORAL_TABLET | Freq: Every day | ORAL | 3 refills | Status: DC

## 2018-02-15 NOTE — Telephone Encounter (Signed)
Pharmacy sent message requesting a refill for Losartan 100 mg. Patient had been taking 1/2 tablet daily. Patient is now taking 1 tablet daily after Dr. [name] advised patient to take a whole tablet if his SBP is above 140.  Called patient to see how he is doing on the Losartan 100 mg daily. Patient stated he decided to take the whole tablet as Dr. [name] instructed at last office visit. Patient stated he has been doing fine on the increased dose and has no issues. Encouraged patient to give our office a call if his BP is running too low or starts having symptoms of low BP. Patient verbalized understanding.

## 2018-02-15 NOTE — Telephone Encounter (Signed)
Needs BMET since dose intensity doubled.

## 2018-02-15 NOTE — Telephone Encounter (Signed)
Left message for patient to call back  

## 2018-02-16 NOTE — Addendum Note (Signed)
Addended by: Aris Georgia, Rihana Kiddy L on: 02/16/2018 09:27 AM   Modules accepted: Orders

## 2018-02-16 NOTE — Telephone Encounter (Signed)
Patient will call back with a date to come in for lab work. Will schedule when patient calls back. Order has been placed for BMET.

## 2018-02-23 ENCOUNTER — Other Ambulatory Visit: Payer: Medicare Other

## 2018-02-23 NOTE — Telephone Encounter (Signed)
Patient coming in for lab work 02/23/18

## 2018-03-02 ENCOUNTER — Other Ambulatory Visit: Payer: Medicare Other | Admitting: *Deleted

## 2018-03-02 DIAGNOSIS — Z79899 Other long term (current) drug therapy: Secondary | ICD-10-CM

## 2018-03-02 DIAGNOSIS — I10 Essential (primary) hypertension: Secondary | ICD-10-CM

## 2018-03-02 LAB — BASIC METABOLIC PANEL
BUN/Creatinine Ratio: 13 (ref 10–24)
BUN: 12 mg/dL (ref 8–27)
CALCIUM: 9.4 mg/dL (ref 8.6–10.2)
CHLORIDE: 102 mmol/L (ref 96–106)
CO2: 24 mmol/L (ref 20–29)
CREATININE: 0.93 mg/dL (ref 0.76–1.27)
GFR calc Af Amer: 94 mL/min/{1.73_m2} (ref >59)
GFR calc non Af Amer: 81 mL/min/{1.73_m2} (ref >59)
Glucose: 90 mg/dL (ref 65–99)
POTASSIUM: 4.6 mmol/L (ref 3.5–5.2)
SODIUM: 141 mmol/L (ref 134–144)

## 2018-03-07 ENCOUNTER — Ambulatory Visit: Payer: Medicare Other | Admitting: Surgery

## 2018-03-07 ENCOUNTER — Ambulatory Visit (HOSPITAL_COMMUNITY)
Admission: RE | Admit: 2018-03-07 | Discharge: 2018-03-07 | Disposition: A | Discharge status: Home / Self Care | Payer: Medicare Other | Source: Ambulatory Visit | Attending: Surgery | Admitting: Surgery

## 2018-03-07 ENCOUNTER — Other Ambulatory Visit: Payer: Self-pay

## 2018-03-07 ENCOUNTER — Encounter: Payer: Self-pay | Admitting: Surgery

## 2018-03-07 VITALS — BP 140/89 | HR 62 | Temp 98.1°F | Resp 16 | Ht 67.0 in | Wt 165.0 lb

## 2018-03-07 DIAGNOSIS — I714 Abdominal aortic aneurysm, without rupture, unspecified: Secondary | ICD-10-CM

## 2018-03-07 DIAGNOSIS — Z09 Encounter for follow-up examination after completed treatment for conditions other than malignant neoplasm: Secondary | ICD-10-CM | POA: Insufficient documentation

## 2018-03-07 NOTE — Progress Notes (Signed)
[facility]  Patient name: Kevin Blake MRN: [id number] DOB: February 24, 1946 Sex: male   REASON FOR VISIT:    Follow up  HISOTRY OF PRESENT ILLNESS:    Kevin Blake is a 72 y.o. 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  His maximum aortic diameter was 4.0 cm.  The right common iliac aneurysm measured 3.3 cm.  His postoperative course was uncomplicated.  He had very minimal right buttock symptoms.  Whatever symptoms he had have completely resolved.  He is walking 2-3 miles per day.  He has no complaints today   PAST MEDICAL HISTORY:   Past Medical History:  Diagnosis Date  . AAA (abdominal aortic aneurysm) ([place])   . Arthritis   . Asthma    as a child  . CAD (coronary artery disease)    s/p angioplasty  . Chronic abdominal pain    RUQ  . Colon polyps   . Diverticulosis   . GERD (gastroesophageal reflux disease)   . HLD (hyperlipidemia)   . HTN (hypertension)   . IBS (irritable bowel syndrome)   . Skin cancer      FAMILY HISTORY:   Family History  Problem Relation Age of Onset  . Heart disease Mother   . Breast cancer Mother   . AAA (abdominal aortic aneurysm) Mother   . Kidney cancer Father   . Heart disease Brother        before age 1  . Hyperlipidemia Brother   . Hypertension Brother   . Heart disease Son   . Liver disease Brother   . Kidney disease Brother     SOCIAL HISTORY:   Social History   Tobacco Use  . Smoking status: Former Smoker    Packs/day: 1.00    Last attempt to quit: 06/08/2000    Years since quitting: 17.7  . Smokeless tobacco: Never Used  Substance Use Topics  . Alcohol use: No    Alcohol/week: 0.0 standard drinks     ALLERGIES:   Allergies  Allergen Reactions  . Lipitor [Atorvastatin]     gen muscle pain  . Lactose  Intolerance (Gi)     Bloating and gas     CURRENT MEDICATIONS:   Current Outpatient Medications  Medication Sig Dispense Refill  . aspirin 81 MG tablet Take 81 mg by mouth daily.      . Coenzyme Q10 200 MG capsule Take 200 mg by mouth daily.    . fluticasone (FLONASE) 50 MCG/ACT nasal spray Place 1 spray into both nostrils daily as needed for allergies or rhinitis.     Marland Kitchen loratadine (CLARITIN) 10 MG tablet Take 10 mg by mouth daily.    Marland Kitchen losartan (COZAAR) 100 MG tablet Take 1 tablet (100 mg total) by mouth daily. 90 tablet 3  . nitroGLYCERIN (NITROSTAT) 0.4 MG SL tablet Place 1 tablet (0.4 mg total) under the tongue every 5 (five) minutes as needed. (Patient taking differently: Place 0.4 mg under the tongue every 5 (five) minutes as needed for chest pain. ) 25 tablet 4  . OVER THE COUNTER MEDICATION Take 1 capsule by mouth daily as needed (gas). IBgard otc stomach relief med    . ranitidine (ZANTAC) 300 MG tablet Take 300 mg by mouth daily as needed for heartburn.    . rosuvastatin (CRESTOR) 20 MG tablet TAKE 1 TABLET ONCE DAILY.  90 tablet 1  . oxyCODONE-acetaminophen (PERCOCET/ROXICET) 5-325 MG tablet Take 1-2 tablets by mouth every 6 (six) hours as needed for moderate pain. (Patient not taking: Reported on 03/07/2018) 8 tablet 0   No current facility-administered medications for this visit.     REVIEW OF SYSTEMS:   [X]  denotes positive finding, [ ]  denotes negative finding Cardiac  Comments:  Chest pain or chest pressure:    Shortness of breath upon exertion:    Short of breath when lying flat:    Irregular heart rhythm:        Vascular    Pain in calf, thigh, or hip brought on by ambulation:    Pain in feet at night that wakes you up from your sleep:     Blood clot in your veins:    Leg swelling:         Pulmonary    Oxygen at home:    Productive cough:     Wheezing:         Neurologic    Sudden weakness in arms or legs:     Sudden numbness in arms or legs:     Sudden  onset of difficulty speaking or slurred speech:    Temporary loss of vision in one eye:     Problems with dizziness:         Gastrointestinal    Blood in stool:     Vomited blood:         Genitourinary    Burning when urinating:     Blood in urine:        Psychiatric    Major depression:         Hematologic    Bleeding problems:    Problems with blood clotting too easily:        Skin    Rashes or ulcers:        Constitutional    Fever or chills:      PHYSICAL EXAM:   Vitals:   03/07/18 0930 03/07/18 0935  BP: (!) 145/92 140/89  Pulse: 61 62  Resp: 16   Temp: 98.1 F (36.7 C)   TempSrc: Oral   SpO2: 98%   Weight: 165 lb (74.8 kg)   Height: 5\' 7"  (1.702 m)     GENERAL: The patient is a well-nourished male, in no acute distress. The vital signs are documented above. CARDIAC: There is a regular rate and rhythm.  VASCULAR: no carotid bruits PULMONARY: Non-labored respirations ABDOMEN: Soft and non-tender with normal pitched bowel sounds. No pulsatile mass MUSCULOSKELETAL: There are no major deformities or cyanosis. NEUROLOGIC: No focal weakness or paresthesias are detected. SKIN: There are no ulcers or rashes noted. PSYCHIATRIC: The patient has a normal affect.  STUDIES:   I have ordered and reviewed his vascular lab studies with the following findings:  Endovascular Aortic Repair (EVAR): +----------+----------------+-------------------+-------------------+      Diameter AP (cm)Diameter Trans (cm)Velocities (cm/sec) +----------+----------------+-------------------+-------------------+ Aorta   3.90      3.60        67          +----------+----------------+-------------------+-------------------+ Right Limb1.40      1.30        62          +----------+----------------+-------------------+-------------------+ Left Limb 1.50      1.00        71           +----------+----------------+-------------------+-------------------+     MEDICAL ISSUES:   AAA: Follow-up surveillance ultrasound in 1  year.  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  I told him that we would get a CT angiogram in approximately 3 years for follow-up.    Annamarie Major, MD [facility] [phone] Pager [phone]

## 2018-03-12 ENCOUNTER — Other Ambulatory Visit: Payer: Self-pay | Admitting: Cardiovascular Disease

## 2018-03-12 DIAGNOSIS — E7849 Other hyperlipidemia: Secondary | ICD-10-CM

## 2018-03-15 ENCOUNTER — Other Ambulatory Visit: Payer: Self-pay | Admitting: Cardiovascular Disease

## 2018-03-15 DIAGNOSIS — E7849 Other hyperlipidemia: Secondary | ICD-10-CM

## 2018-03-15 MED ORDER — ROSUVASTATIN CALCIUM 20 MG PO TABS: 20.0000 mg | ORAL_TABLET | Freq: Every day | ORAL | 1 refills | Status: DC

## 2018-03-15 NOTE — Telephone Encounter (Signed)
Pt's medication was sent to pt's pharmacy as requested. Confirmation received.  °

## 2018-08-22 ENCOUNTER — Other Ambulatory Visit: Payer: Self-pay | Admitting: Cardiovascular Disease

## 2018-08-22 DIAGNOSIS — E7849 Other hyperlipidemia: Secondary | ICD-10-CM

## 2018-09-27 DIAGNOSIS — K589 Irritable bowel syndrome without diarrhea: Secondary | ICD-10-CM | POA: Insufficient documentation

## 2018-09-27 DIAGNOSIS — K219 Gastro-esophageal reflux disease without esophagitis: Secondary | ICD-10-CM | POA: Insufficient documentation

## 2018-09-27 DIAGNOSIS — K5730 Diverticulosis of large intestine without perforation or abscess without bleeding: Secondary | ICD-10-CM | POA: Insufficient documentation

## 2018-09-27 DIAGNOSIS — K635 Polyp of colon: Secondary | ICD-10-CM | POA: Insufficient documentation

## 2018-09-27 DIAGNOSIS — J45909 Unspecified asthma, uncomplicated: Secondary | ICD-10-CM | POA: Insufficient documentation

## 2018-10-02 DIAGNOSIS — Z8249 Family history of ischemic heart disease and other diseases of the circulatory system: Secondary | ICD-10-CM | POA: Insufficient documentation

## 2018-10-18 ENCOUNTER — Encounter: Payer: Self-pay | Admitting: Gastroenterology

## 2018-11-07 ENCOUNTER — Encounter: Payer: Self-pay | Admitting: Gastroenterology

## 2018-11-14 ENCOUNTER — Other Ambulatory Visit: Payer: Self-pay

## 2018-11-14 ENCOUNTER — Ambulatory Visit (AMBULATORY_SURGERY_CENTER): Payer: Self-pay | Admitting: *Deleted

## 2018-11-14 VITALS — Ht 67.0 in | Wt 170.0 lb

## 2018-11-14 DIAGNOSIS — Z86010 Personal history of colonic polyps: Secondary | ICD-10-CM

## 2018-11-14 MED ORDER — NA SULFATE-K SULFATE-MG SULF 17.5-3.13-1.6 GM/177ML PO SOLN: 1.0000 | Freq: Once | ORAL | 0 refills | Status: AC

## 2018-11-14 NOTE — Progress Notes (Signed)
No egg or soy allergy known to patient  No issues with past sedation with any surgeries  or procedures, no intubation problems  No diet pills per patient No home 02 use per patient  No blood thinners per patient  Pt denies issues with constipation  No A fib or A flutter  EMMI video sent to pt's e mail    Pt verified name, DOB, address and insurance during PV today. Pt mailed instruction packet to included paper to complete and mail back to [facility] with addressed and stamped envelope, Emmi video, copy of consent form to read and not return, and instructions. PV completed over the phone. Pt encouraged to call with questions or issues    Pt is aware that care partner will wait in the car during parking lot; if they feel like they will be too hot to wait in the car; they may wait in the lobby.  We want them to wear a mask (we do not have any that we can provide them), practice social distancing, and we will check their temperatures when they get here.  I did remind patient that their care partner needs to stay in the parking lot the entire time. Pt will wear mask into building

## 2018-11-18 ENCOUNTER — Telehealth: Payer: Self-pay

## 2018-11-18 NOTE — Telephone Encounter (Signed)
Covid-19 screening questions  Have you traveled in the last 14 days? If yes where?  No   Do you now or have you had a fever in the last 14 days? No   Do you have any respiratory symptoms of shortness of breath or cough now or in the last 14 days? No  Do you have any family members or close contacts with diagnosed or suspected Covid-19 in the past 14 days? No   Have you been tested for Covid-19 and found to be positive? No        

## 2018-11-21 ENCOUNTER — Ambulatory Visit (AMBULATORY_SURGERY_CENTER): Payer: Medicare Other | Admitting: Gastroenterology

## 2018-11-21 ENCOUNTER — Other Ambulatory Visit: Payer: Self-pay

## 2018-11-21 ENCOUNTER — Encounter: Payer: Self-pay | Admitting: Gastroenterology

## 2018-11-21 VITALS — BP 85/58 | HR 57 | Temp 97.4°F | Resp 19 | Ht 67.0 in | Wt 170.0 lb

## 2018-11-21 DIAGNOSIS — Z86010 Personal history of colonic polyps: Secondary | ICD-10-CM | POA: Diagnosis not present

## 2018-11-21 MED ORDER — SODIUM CHLORIDE 0.9 % IV SOLN: 500.0000 mL | INTRAVENOUS | Status: DC

## 2018-11-21 NOTE — Progress Notes (Signed)
Pt's states no medical or surgical changes since previsit or office visit. 

## 2018-11-21 NOTE — Patient Instructions (Signed)
Information on diverticulosis and hemorrhoids given to you today.  High fiber diet.  Return to GI clinic in 12 weeks.  YOU HAD AN ENDOSCOPIC PROCEDURE TODAY AT [facility]:   Refer to the procedure report that was given to you for any specific questions about what was found during the examination.  If the procedure report does not answer your questions, please call your gastroenterologist to clarify.  If you requested that your care partner not be given the details of your procedure findings, then the procedure report has been included in a sealed envelope for you to review at your convenience later.  YOU SHOULD EXPECT: Some feelings of bloating in the abdomen. Passage of more gas than usual.  Walking can help get rid of the air that was put into your GI tract during the procedure and reduce the bloating. If you had a lower endoscopy (such as a colonoscopy or flexible sigmoidoscopy) you may notice spotting of blood in your stool or on the toilet paper. If you underwent a bowel prep for your procedure, you may not have a normal bowel movement for a few days.  Please Note:  You might notice some irritation and congestion in your nose or some drainage.  This is from the oxygen used during your procedure.  There is no need for concern and it should clear up in a day or so.  SYMPTOMS TO REPORT IMMEDIATELY:   Following lower endoscopy (colonoscopy or flexible sigmoidoscopy):  Excessive amounts of blood in the stool  Significant tenderness or worsening of abdominal pains  Swelling of the abdomen that is new, acute  Fever of 100F or higher For urgent or emergent issues, a gastroenterologist can be reached at any hour by calling [phone].   DIET:  We do recommend a small meal at first, but then you may proceed to your regular diet.  Drink plenty of fluids but you should avoid alcoholic beverages for 24 hours.  ACTIVITY:  You should plan to take it easy for the rest of today  and you should NOT DRIVE or use heavy machinery until tomorrow (because of the sedation medicines used during the test).    FOLLOW UP: Our staff will call the number listed on your records 48-72 hours following your procedure to check on you and address any questions or concerns that you may have regarding the information given to you following your procedure. If we do not reach you, we will leave a message.  We will attempt to reach you two times.  During this call, we will ask if you have developed any symptoms of COVID 19. If you develop any symptoms (ie: fever, flu-like symptoms, shortness of breath, cough etc.) before then, please call [phone].  If you test positive for Covid 19 in the 2 weeks post procedure, please call and report this information to [place].    If any biopsies were taken you will be contacted by phone or by letter within the next 1-3 weeks.  Please call us at [phone] if you have not heard about the biopsies in 3 weeks.    SIGNATURES/CONFIDENTIALITY: You and/or your care partner have signed paperwork which will be entered into your electronic medical record.  These signatures attest to the fact that that the information above on your After Visit Summary has been reviewed and is understood.  Full responsibility of the confidentiality of this discharge information lies with you and/or your care-partner.

## 2018-11-21 NOTE — Op Note (Signed)
[facility] Patient Name: Kevin Blake Procedure Date: 11/21/2018 12:49 PM MRN: [id number] Endoscopist: Jackquline Denmark , MD Age: 73 Referring MD:  Date of Birth: 01/14/1946 Gender: Male Account #: [id number] Procedure:                Colonoscopy Indications:              High risk colon cancer surveillance: Personal                            history of colonic polyps Medicines:                Monitored Anesthesia Care Procedure:                Pre-Anesthesia Assessment:                           - Prior to the procedure, a History and Physical                            was performed, and patient medications and                            allergies were reviewed. The patient's tolerance of                            previous anesthesia was also reviewed. The risks                            and benefits of the procedure and the sedation                            options and risks were discussed with the patient.                            All questions were answered, and informed consent                            was obtained. Prior Anticoagulants: The patient has                            taken no previous anticoagulant or antiplatelet                            agents. ASA Grade Assessment: II - A patient with                            mild systemic disease. After reviewing the risks                            and benefits, the patient was deemed in                            satisfactory condition to undergo the procedure.  After obtaining informed consent, the colonoscope                            was passed under direct vision. Throughout the                            procedure, the patient's blood pressure, pulse, and                            oxygen saturations were monitored continuously. The                            Colonoscope was introduced through the anus and                            advanced to the 2 cm into the ileum. The                             colonoscopy was performed without difficulty. The                            patient tolerated the procedure well. The quality                            of the bowel preparation was good. The terminal                            ileum, ileocecal valve, appendiceal orifice, and                            rectum were photographed. Scope In: 1:07:16 PM Scope Out: 1:17:35 PM Scope Withdrawal Time: 0 hours 7 minutes 6 seconds  Total Procedure Duration: 0 hours 10 minutes 19 seconds  Findings:                 Multiple small and large-mouthed diverticula were                            found in the sigmoid colon, descending colon, few                            scattered in transverse colon and ascending colon.                            There was mild SCAD in the sigmoid colon.                           Non-bleeding internal hemorrhoids were found during                            retroflexion. The hemorrhoids were small.                           The exam was otherwise without abnormality.  Terminal ileum was normal. Complications:            No immediate complications. Estimated Blood Loss:     Estimated blood loss: none. Impression:               -Pancolonic diverticulosis predominantly in the                            sigmoid colon.                           -Small internal hemorrhoids.                           -Otherwise normal colonoscopy to TI. Recommendation:           - Patient has a contact number available for                            emergencies. The signs and symptoms of potential                            delayed complications were discussed with the                            patient. Return to normal activities tomorrow.                            Written discharge instructions were provided to the                            patient.                           - High fiber diet.                           - Repeat  colonoscopy only if with any new problems                            or if there is any change in family history.                           - Return to GI office in 12 weeks. Jackquline Denmark, MD 11/21/2018 1:23:25 PM This report has been signed electronically.

## 2018-11-21 NOTE — Progress Notes (Signed)
PT taken to PACU. Monitors in place. VSS. Report given to RN. 

## 2018-11-23 ENCOUNTER — Telehealth: Payer: Self-pay

## 2018-11-23 NOTE — Telephone Encounter (Signed)
  Follow up Call-  Call back number 11/21/2018  Post procedure Call Back phone  # [phone] or [phone]  Permission to leave phone message Yes  Some recent data might be hidden     Patient questions:  Do you have a fever, pain , or abdominal swelling? No. Pain Score  0 *  Have you tolerated food without any problems? Yes.    Have you been able to return to your normal activities? Yes.    Do you have any questions about your discharge instructions: Diet   No. Medications  No. Follow up visit  No.  Do you have questions or concerns about your Care? No.  Actions: * If pain score is 4 or above: No action needed, pain <4.  1. Have you developed a fever since your procedure? no  2.   Have you had an respiratory symptoms (SOB or cough) since your procedure? no  3.   Have you tested positive for COVID 19 since your procedure no  4.   Have you had any family members/close contacts diagnosed with the COVID 19 since your procedure?  no   If yes to any of these questions please route to Joylene John, RN and Alphonsa Gin, Therapist, sports.

## 2018-11-28 ENCOUNTER — Encounter: Payer: Medicare Other | Admitting: Gastroenterology

## 2019-02-13 NOTE — Progress Notes (Signed)
.newlogo   73 y.o. f/u PVD and CAD. Distant stent to RCA in 1997.  Normal stress test 2009.  CRF;s HTN and HLD Sees Dr Trula Slade for vascular disease. 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 AAA only 3.9 cm.  Has chronic abdominal pain with IBS.  Noted to have bradycardia today on no AV nodal drugs  Denies chest pain  Compliant with meds   ROS: Denies fever, malais, weight loss, blurry vision, decreased visual acuity, cough, sputum, SOB, hemoptysis, pleuritic pain, palpitaitons, heartburn, abdominal pain, melena, lower extremity edema, claudication, or rash.  All other systems reviewed and negative  BP 130/72   Pulse (!) 48   Ht 5\' 8"  (1.727 m)   Wt 166 lb (75.3 kg)   SpO2 98%   BMI 25.24 kg/m  Affect appropriate Healthy:  appears stated age 73: normal Neck supple with no adenopathy JVP normal no bruits no thyromegaly Lungs clear with no wheezing and good diaphragmatic motion Heart:  S1/S2 no murmur, no rub, gallop or click PMI normal Abdomen: benighn, BS positve, no tenderness, no AAA no bruit.  No HSM or HJR Distal pulses intact with no bruits No edema Neuro non-focal Skin warm and dry No muscular weakness    Current Outpatient Medications  Medication Sig Dispense Refill  . aspirin 81 MG tablet Take 81 mg by mouth daily.      . Coenzyme Q10 200 MG capsule Take 200 mg by mouth daily.    Marland Kitchen ezetimibe (ZETIA) 10 MG tablet Take 10 mg by mouth daily.    . fluticasone (FLONASE) 50 MCG/ACT nasal spray Place 1 spray into both nostrils daily as needed for allergies or rhinitis.     Marland Kitchen loratadine (CLARITIN) 10 MG tablet Take 10 mg by mouth daily.    Marland Kitchen losartan (COZAAR) 100 MG tablet Take 1 tablet (100 mg total) by mouth daily. 90 tablet 3  . nitroGLYCERIN (NITROSTAT) 0.4 MG SL tablet Place 1 tablet (0.4 mg total) under the tongue every 5 (five) minutes as  needed. 25 tablet 4  . OVER THE COUNTER MEDICATION Take 1 capsule by mouth daily as needed (gas). IBgard otc stomach relief med    . oxyCODONE-acetaminophen (PERCOCET/ROXICET) 5-325 MG tablet Take 1-2 tablets by mouth every 6 (six) hours as needed for moderate pain. 8 tablet 0  . pseudoephedrine-acetaminophen (TYLENOL SINUS) 30-500 MG TABS tablet Take 1 tablet by mouth every 6 (six) hours as needed.    . psyllium (METAMUCIL) 58.6 % powder Take 1 packet by mouth 3 (three) times daily. 1 tbsp once a day    . ranitidine (ZANTAC) 300 MG tablet Take 300 mg by mouth daily as needed for heartburn.    . rosuvastatin (CRESTOR) 20 MG tablet Take 1 tablet (20 mg total) by mouth daily. Please make annual appt for future refills. [phone]. 1st attempt. 90 tablet 0   No current facility-administered medications for this visit.     Allergies  Lipitor [atorvastatin] and Lactose intolerance (gi)  Electrocardiogram:  01/10/14  SR rate 58  RBBB   01/2015  SR  Rate 63  RBBB  02/22/74 SR rate 48 RBBB no acute changes   Assessment and Plan  CAD: Distant RCA stent f/u ETT   02/19/15 was normal stable observe New nitro called in  HTN:   He seems to think it is ok at 73 home will monitor told him to take  whole Cozaar tablet If systolic running over [id number] mmHg consistently Given distant stress testing and bradycardia Will order ETT  Iliac Aneurysm  Post endovascular repair 07/17/17 f/u [facility] for surveillance of AAA 3.9 cm on [place] 03/07/18 no endoleak  Chol:  Continue statin LDL 84 on 08/24/16   Lab Results  Component Value Date   LDLCALC 84 08/24/2016   IBS:  F/u GI no pathology identified in past   Bradycardia:  Will order ETT see above r/o chronotropic incompetence Avoid beta blockers         Jenkins Rouge

## 2019-02-23 ENCOUNTER — Other Ambulatory Visit: Payer: Self-pay

## 2019-02-23 ENCOUNTER — Ambulatory Visit (INDEPENDENT_AMBULATORY_CARE_PROVIDER_SITE_OTHER): Payer: Medicare Other | Admitting: Cardiovascular Disease

## 2019-02-23 ENCOUNTER — Encounter: Payer: Self-pay | Admitting: Cardiovascular Disease

## 2019-02-23 VITALS — BP 130/72 | HR 48 | Ht 68.0 in | Wt 166.0 lb

## 2019-02-23 DIAGNOSIS — I2510 Atherosclerotic heart disease of native coronary artery without angina pectoris: Secondary | ICD-10-CM

## 2019-02-23 NOTE — Patient Instructions (Signed)
Medication Instructions:   If you need a refill on your cardiac medications before your next appointment, please call your pharmacy.   Lab work:  If you have labs (blood work) drawn today and your tests are completely normal, you will receive your results only by: Marland Kitchen MyChart Message (if you have MyChart) OR . A paper copy in the mail If you have any lab test that is abnormal or we need to change your treatment, we will call you to review the results.  Testing/Procedures: Your physician has requested that you have an exercise tolerance test. For further information please visit [email or web address]. Please also follow instruction sheet, as given.  Follow-Up: At [facility], you and your health needs are our priority.  As part of our continuing mission to provide you with exceptional heart care, we have created designated Provider Care Teams.  These Care Teams include your primary Cardiologist (physician) and Advanced Practice Providers (APPs -  Physician Assistants and Nurse Practitioners) who all work together to provide you with the care you need, when you need it. You will need a follow up appointment in 1 year.  Please call our office 2 months in advance to schedule this appointment.  You may see Dr. [name] or one of the following Advanced Practice Providers on your designated Care Team:   Truitt Merle, NP Cecilie Kicks, NP . Kathyrn Drown, NP

## 2019-03-03 ENCOUNTER — Telehealth (HOSPITAL_COMMUNITY): Payer: Self-pay

## 2019-03-03 NOTE — Telephone Encounter (Signed)
Encounter complete. 

## 2019-03-04 ENCOUNTER — Other Ambulatory Visit (HOSPITAL_COMMUNITY)
Admission: RE | Admit: 2019-03-04 | Discharge: 2019-03-04 | Disposition: A | Discharge status: Home / Self Care | Payer: Medicare Other | Source: Ambulatory Visit | Attending: Cardiovascular Disease | Admitting: Cardiovascular Disease

## 2019-03-04 DIAGNOSIS — Z01812 Encounter for preprocedural laboratory examination: Secondary | ICD-10-CM | POA: Insufficient documentation

## 2019-03-04 DIAGNOSIS — Z20828 Contact with and (suspected) exposure to other viral communicable diseases: Secondary | ICD-10-CM | POA: Diagnosis not present

## 2019-03-05 LAB — NOVEL CORONAVIRUS, NAA (HOSP ORDER, SEND-OUT TO REF LAB; TAT 18-24 HRS): SARS-CoV-2, NAA: NOT DETECTED

## 2019-03-06 ENCOUNTER — Other Ambulatory Visit: Payer: Self-pay | Admitting: Cardiovascular Disease

## 2019-03-06 DIAGNOSIS — E7849 Other hyperlipidemia: Secondary | ICD-10-CM

## 2019-03-07 ENCOUNTER — Telehealth (HOSPITAL_COMMUNITY): Payer: Self-pay | Admitting: *Deleted

## 2019-03-07 NOTE — Telephone Encounter (Signed)
Close encounter 

## 2019-03-08 ENCOUNTER — Ambulatory Visit (HOSPITAL_COMMUNITY)
Admission: RE | Admit: 2019-03-08 | Discharge: 2019-03-08 | Disposition: A | Discharge status: Home / Self Care | Payer: Medicare Other | Source: Ambulatory Visit | Attending: Cardiovascular Disease | Admitting: Cardiovascular Disease

## 2019-03-08 ENCOUNTER — Other Ambulatory Visit: Payer: Self-pay

## 2019-03-08 DIAGNOSIS — I2510 Atherosclerotic heart disease of native coronary artery without angina pectoris: Secondary | ICD-10-CM

## 2019-03-08 LAB — EXERCISE TOLERANCE TEST
Estimated workload: 10.1 METS
Exercise duration (min): 8 min
Exercise duration (sec): 17 s
MPHR: 146 {beats}/min
Peak HR: 136 {beats}/min
Percent HR: 93 %
RPE: 16
Rest HR: 60 {beats}/min

## 2019-03-10 ENCOUNTER — Telehealth: Payer: Self-pay | Admitting: Cardiovascular Disease

## 2019-03-10 NOTE — Telephone Encounter (Signed)
Patient returned call for his stress test results.  

## 2019-03-10 NOTE — Telephone Encounter (Signed)
Left message for patient to call back  

## 2019-03-10 NOTE — Telephone Encounter (Signed)
Follow Up  Patient returning call. Please give patient a call back.  

## 2019-03-10 NOTE — Telephone Encounter (Signed)
Called patient with results. Per Dr. [name], normal ETT. Patient verbalized understanding.

## 2019-03-27 DIAGNOSIS — Z0000 Encounter for general adult medical examination without abnormal findings: Secondary | ICD-10-CM | POA: Insufficient documentation

## 2019-04-04 ENCOUNTER — Other Ambulatory Visit: Payer: Self-pay | Admitting: *Deleted

## 2019-04-04 MED ORDER — LOSARTAN POTASSIUM 100 MG PO TABS: 100.0000 mg | ORAL_TABLET | Freq: Every day | ORAL | 3 refills | Status: DC

## 2019-09-04 DIAGNOSIS — K5792 Diverticulitis of intestine, part unspecified, without perforation or abscess without bleeding: Secondary | ICD-10-CM | POA: Insufficient documentation

## 2019-09-11 ENCOUNTER — Other Ambulatory Visit: Payer: Self-pay

## 2019-09-11 DIAGNOSIS — E7849 Other hyperlipidemia: Secondary | ICD-10-CM

## 2019-09-11 MED ORDER — ROSUVASTATIN CALCIUM 20 MG PO TABS: 20.0000 mg | ORAL_TABLET | Freq: Every day | ORAL | 1 refills | Status: DC

## 2019-10-26 ENCOUNTER — Emergency Department (HOSPITAL_COMMUNITY)
Admission: EM | Admit: 2019-10-26 | Discharge: 2019-10-26 | Disposition: A | Discharge status: Home / Self Care | Payer: Medicare PPO | Attending: Emergency Medicine | Admitting: Emergency Medicine

## 2019-10-26 ENCOUNTER — Encounter (HOSPITAL_COMMUNITY): Payer: Self-pay | Admitting: *Deleted

## 2019-10-26 DIAGNOSIS — Z7982 Long term (current) use of aspirin: Secondary | ICD-10-CM | POA: Diagnosis not present

## 2019-10-26 DIAGNOSIS — I2510 Atherosclerotic heart disease of native coronary artery without angina pectoris: Secondary | ICD-10-CM | POA: Insufficient documentation

## 2019-10-26 DIAGNOSIS — Z87891 Personal history of nicotine dependence: Secondary | ICD-10-CM | POA: Insufficient documentation

## 2019-10-26 DIAGNOSIS — H81399 Other peripheral vertigo, unspecified ear: Secondary | ICD-10-CM

## 2019-10-26 DIAGNOSIS — Z79899 Other long term (current) drug therapy: Secondary | ICD-10-CM | POA: Diagnosis not present

## 2019-10-26 DIAGNOSIS — I10 Essential (primary) hypertension: Secondary | ICD-10-CM | POA: Diagnosis not present

## 2019-10-26 DIAGNOSIS — R42 Dizziness and giddiness: Secondary | ICD-10-CM | POA: Diagnosis not present

## 2019-10-26 LAB — CBC
HCT: 44.4 % (ref 39.0–52.0)
Hemoglobin: 14.5 g/dL (ref 13.0–17.0)
MCH: 29.9 pg (ref 26.0–34.0)
MCHC: 32.7 g/dL (ref 30.0–36.0)
MCV: 91.5 fL (ref 80.0–100.0)
Platelets: 201 10*3/uL (ref 150–400)
RBC: 4.85 MIL/uL (ref 4.22–5.81)
RDW: 12.6 % (ref 11.5–15.5)
WBC: 8.7 10*3/uL (ref 4.0–10.5)
nRBC: 0 % (ref 0.0–0.2)

## 2019-10-26 LAB — BASIC METABOLIC PANEL
Anion gap: 10 (ref 5–15)
BUN: 13 mg/dL (ref 8–23)
CO2: 25 mmol/L (ref 22–32)
Calcium: 9.3 mg/dL (ref 8.9–10.3)
Chloride: 106 mmol/L (ref 98–111)
Creatinine, Ser: 1 mg/dL (ref 0.61–1.24)
GFR calc Af Amer: >60 mL/min (ref >60)
GFR calc non Af Amer: >60 mL/min (ref >60)
Glucose, Bld: 99 mg/dL (ref 70–99)
Potassium: 4.1 mmol/L (ref 3.5–5.1)
Sodium: 141 mmol/L (ref 135–145)

## 2019-10-26 LAB — TROPONIN I (HIGH SENSITIVITY)
Troponin I (High Sensitivity): 3 ng/L (ref <18)
Troponin I (High Sensitivity): 4 ng/L (ref <18)

## 2019-10-26 MED ORDER — MECLIZINE HCL 25 MG PO TABS
25.0000 mg | ORAL_TABLET | Freq: Three times a day (TID) | ORAL | 0 refills | Status: DC | PRN
Start: 2019-10-26 — End: 2020-03-14

## 2019-10-26 MED ORDER — SODIUM CHLORIDE 0.9% FLUSH: 3.0000 mL | Freq: Once | INTRAVENOUS | Status: DC

## 2019-10-26 MED ORDER — MECLIZINE HCL 25 MG PO TABS
25.0000 mg | ORAL_TABLET | Freq: Once | ORAL | Status: AC
  Administered 2019-10-26: 25 mg via ORAL
  Filled 2019-10-26: qty 1

## 2019-10-26 NOTE — ED Provider Notes (Signed)
[facility] EMERGENCY DEPARTMENT Provider Note   CSN: [id number] Arrival date & time: 10/26/19  1005     History Chief Complaint  Patient presents with  . Dizziness    Kevin Blake is a 74 y.o. male.  Patient is a 74 year old male with a history of AAA, CAD status post stenting years ago, allergies, asthma, hypertension and hyperlipidemia presenting today with sudden onset of dizziness.  Patient reports it felt like he was sitting on a merry-go-round going in circles.  This started while he was mowing the grass today.  He had to hold onto the side of the house or reports he would have fallen.  The severe symptoms lasted for approximately 30 minutes but have now almost completely resolved.  He states he still feels a little bit unsteady if he were to try to walk but does not feel dizzy at rest.  He denied any unilateral numbness, weakness, facial droop, speech difficulty or swallowing difficulty.  He did not feel like he was going to pass out.  He denied any chest pain, palpitations or shortness of breath.  Patient reports he has had more muscle cramps recently in his lower extremities.  No recent infectious symptoms with fever.  No vomiting or diarrhea.  Patient reports he has been eating like normal and no reason he should be dehydrated.  He is a very active individual and denies any recent change in routine  The history is provided by the patient and the spouse.  Dizziness Quality:  Room spinning Severity:  Severe Onset quality:  Sudden Duration: 30 minutes. Timing:  Constant Progression:  Partially resolved (almost completely gone but just feels mildly not himself) Chronicity:  New Context comment:  Started while mowing the grass this morning Relieved by:  Being still Exacerbated by: walking. Ineffective treatments:  None tried Associated symptoms: no chest pain, no headaches, no hearing loss, no nausea, no palpitations, no shortness of breath, no syncope, no  vision changes and no vomiting   Associated symptoms comment:  Has had a lot of congestion this year with runny nose, drainage down the throat and occasional dry cough.  No prior history of vertigo.  Patient reports he had a AAA repaired 18 months ago and has ongoing issues with chronic abdominal and back pain but nothing new. Risk factors: heart disease   Risk factors: no new medications        Past Medical History:  Diagnosis Date  . AAA (abdominal aortic aneurysm) ([place])   . Allergy   . Arthritis   . Asthma    as a child  . CAD (coronary artery disease)    s/p angioplasty  . Cataract    removed both eyes  . Chronic abdominal pain    RUQ  . Colon polyps   . Diverticulosis   . GERD (gastroesophageal reflux disease)    pt states im[proved   . History of atrial fibrillation    when recieved a stent   . HLD (hyperlipidemia)   . HTN (hypertension)   . IBS (irritable bowel syndrome)   . Skin cancer     Patient Active Problem List   Diagnosis Date Noted  . AAA (abdominal aortic aneurysm) ([place]) 07/14/2017  . Aneurysm of iliac artery ([facility]) 03/27/2013  . Ventral hernia 01/06/2013  . CAD (coronary artery disease)   . HLD (hyperlipidemia)   . HTN (hypertension)     Past Surgical History:  Procedure Laterality Date  . ABDOMINAL AORTIC ENDOVASCULAR STENT  GRAFT N/A 07/14/2017   Procedure: ABDOMINAL AORTIC ENDOVASCULAR STENT GRAFT, INSERTION WITH RIGHT ILIAC BRANCH DEVICE;  Surgeon: Serafina Mitchell, MD;  Location: [facility];  Service: Vascular;  Laterality: N/A;  . [place]  . CHOLECYSTECTOMY    . COLONOSCOPY    . COLONOSCOPY    . CORONARY ANGIOPLASTY WITH STENT PLACEMENT  1997  . EXPLORATORY LAPAROTOMY    . EYE SURGERY     cataracts  . KNEE SURGERY    . POLYPECTOMY    . TENDON REPAIR         Family History  Problem Relation Age of Onset  . Heart disease Mother   . Breast cancer Mother   . AAA (abdominal aortic aneurysm) Mother   . Kidney cancer  Father        reoccurance 30 yrs later and mets all over body   . Heart disease Brother        before age 67  . Hyperlipidemia Brother   . Hypertension Brother   . Heart disease Son   . Liver disease Brother   . Kidney disease Brother   . Colon cancer Neg Hx   . Colon polyps Neg Hx   . Esophageal cancer Neg Hx   . Rectal cancer Neg Hx   . Stomach cancer Neg Hx     Social History   Tobacco Use  . Smoking status: Former Smoker    Packs/day: 1.00    Quit date: 06/08/2000    Years since quitting: 19.3  . Smokeless tobacco: Former Network engineer Use Topics  . Alcohol use: Yes    Alcohol/week: 0.0 standard drinks    Comment: occ beer   . Drug use: No    Home Medications Prior to Admission medications   Medication Sig Start Date End Date Taking? Authorizing Provider  aspirin 81 MG tablet Take 81 mg by mouth daily.      [provider]  Coenzyme Q10 200 MG capsule Take 200 mg by mouth daily.    [provider]  ezetimibe (ZETIA) 10 MG tablet Take 10 mg by mouth daily. 12/07/18   [provider]  fluticasone (FLONASE) 50 MCG/ACT nasal spray Place 1 spray into both nostrils daily as needed for allergies or rhinitis.  05/06/15   [provider]  loratadine (CLARITIN) 10 MG tablet Take 10 mg by mouth daily.    [provider]  losartan (COZAAR) 100 MG tablet Take 1 tablet (100 mg total) by mouth daily. 04/04/19   Josue Hector, MD  nitroGLYCERIN (NITROSTAT) 0.4 MG SL tablet Place 1 tablet (0.4 mg total) under the tongue every 5 (five) minutes as needed. 08/24/16   Josue Hector, MD  OVER THE COUNTER MEDICATION Take 1 capsule by mouth daily as needed (gas). IBgard otc stomach relief med    [provider]  oxyCODONE-acetaminophen (PERCOCET/ROXICET) 5-325 MG tablet Take 1-2 tablets by mouth every 6 (six) hours as needed for moderate pain. 07/15/17   Dagoberto Ligas, PA-C  pseudoephedrine-acetaminophen (TYLENOL SINUS) 30-500 MG TABS  tablet Take 1 tablet by mouth every 6 (six) hours as needed.    [provider]  psyllium (METAMUCIL) 58.6 % powder Take 1 packet by mouth 3 (three) times daily. 1 tbsp once a day    [provider]  ranitidine (ZANTAC) 300 MG tablet Take 300 mg by mouth daily as needed for heartburn.    [provider]  rosuvastatin (CRESTOR) 20 MG tablet Take  1 tablet (20 mg total) by mouth daily. 09/11/19   Josue Hector, MD    Allergies    Lipitor [atorvastatin], Lisinopril, Metoprolol, and Lactose intolerance (gi)  Review of Systems   Review of Systems  HENT: Negative for hearing loss.   Respiratory: Negative for shortness of breath.   Cardiovascular: Negative for chest pain, palpitations and syncope.  Gastrointestinal: Negative for nausea and vomiting.  Neurological: Positive for dizziness. Negative for headaches.  All other systems reviewed and are negative.   Physical Exam Updated Vital Signs BP (!) 144/79 (BP Location: Left Arm)   Pulse (!) 55   Temp 98 F (36.7 C) (Oral)   Resp 14   Ht 5\' 7"  (1.702 m)   Wt 72.6 kg   SpO2 98%   BMI 25.06 kg/m   Physical Exam Vitals and nursing note reviewed.  Constitutional:      General: He is not in acute distress.    Appearance: Normal appearance. He is well-developed and normal weight.  HENT:     Head: Normocephalic and atraumatic.     Right Ear: Tympanic membrane normal.     Left Ear: Tympanic membrane normal.     Ears:     Comments: Dried blood present in the left ear canal    Nose: Congestion present.     Mouth/Throat:     Mouth: Mucous membranes are moist.     Comments: Trace erythema present in the posterior pharynx Eyes:     General: No visual field deficit.    Extraocular Movements: Extraocular movements intact.     Conjunctiva/sclera: Conjunctivae normal.     Pupils: Pupils are equal, round, and reactive to light.     Comments: No nystagmus  Cardiovascular:     Rate and Rhythm: Normal rate and  regular rhythm.     Heart sounds: No murmur.  Pulmonary:     Effort: Pulmonary effort is normal. No respiratory distress.     Breath sounds: Normal breath sounds. No wheezing or rales.  Abdominal:     General: There is no distension.     Palpations: Abdomen is soft.     Tenderness: There is no abdominal tenderness. There is no guarding or rebound.  Musculoskeletal:        General: No tenderness. Normal range of motion.     Cervical back: Normal range of motion and neck supple. No rigidity or tenderness.  Lymphadenopathy:     Cervical: No cervical adenopathy.  Skin:    General: Skin is warm and dry.     Findings: No erythema or rash.  Neurological:     General: No focal deficit present.     Mental Status: He is alert and oriented to person, place, and time. Mental status is at baseline.     Cranial Nerves: No cranial nerve deficit, dysarthria or facial asymmetry.     Sensory: No sensory deficit.     Motor: No weakness or pronator drift.     Coordination: Finger-Nose-Finger Test and Heel to [employer] normal.  Psychiatric:        Mood and Affect: Mood normal.        Behavior: Behavior normal.        Thought Content: Thought content normal.     ED Results / Procedures / Treatments   Labs (all labs ordered are listed, but only abnormal results are displayed) Labs Reviewed  BASIC METABOLIC PANEL  CBC  TROPONIN I (HIGH SENSITIVITY)  TROPONIN I (HIGH SENSITIVITY)  EKG EKG Interpretation  Date/Time:  Thursday Oct 26 2019 10:13:45 EDT Ventricular Rate:  56 PR Interval:  156 QRS Duration: 128 QT Interval:  466 QTC Calculation: 449 R Axis:   -27 Text Interpretation: Sinus bradycardia Right bundle branch block No significant change since last tracing Confirmed by Blanchie Dessert [phone]) on 10/26/2019 10:55:12 AM   Radiology No results found.  Procedures Procedures (including critical care time)  Medications Ordered in ED Medications  sodium chloride flush (NS) 0.9  % injection 3 mL (has no administration in time range)  meclizine (ANTIVERT) tablet 25 mg (25 mg Oral Given 10/26/19 1205)    ED Course  I have reviewed the triage vital signs and the nursing notes.  Pertinent labs & imaging results that were available during my care of the patient were reviewed by me and considered in my medical decision making (see chart for details).    MDM Rules/Calculators/A&P                      Elderly male with multiple medical problems presenting today after sudden onset of dizziness which is best described as vertigo.  Patient reports symptoms lasted for approximately 30 minutes but denies any headache or neck pain.  Low suspicion for vertebral dissection or intracranial hemorrhage.  Patient's neurologic exam is within normal limits with lower suspicion for stroke at this time however patient does have risk factors.  He denies any cardiac complaints and lower suspicion that this is cardiac or respiratory in nature.  Patient's EKG shows a right bundle branch block which is unchanged, BMP with normal renal function and electrolytes, CBC without evidence of anemia. Initial troponin is 3 and low suspicion for cardiac cause. Will give patient meclizine and attempt to ambulate.  If he is ataxic he will need MRI to rule out stroke.  Vital signs remained stable.  1:10 PM After meclizine patient states he feels back to normal.  He ambulated to the bathroom without difficulty or return of dizziness.  No ataxia noted.  Will discharge patient home with diagnosis of peripheral vertigo.  MDM Number of Diagnoses or Management Options   Amount and/or Complexity of Data Reviewed Clinical lab tests: ordered and reviewed Tests in the medicine section of CPT: ordered and reviewed Decide to obtain previous medical records or to obtain history from someone other than the patient: yes Obtain history from someone other than the patient: yes Review and summarize past medical records:  yes Discuss the patient with other providers: no Independent visualization of images, tracings, or specimens: yes  Risk of Complications, Morbidity, and/or Mortality Presenting problems: moderate Diagnostic procedures: low Management options: low  Patient Progress Patient progress: improved   Final Clinical Impression(s) / ED Diagnoses Final diagnoses:  Peripheral vertigo, unspecified laterality    Rx / DC Orders ED Discharge Orders         Ordered    meclizine (ANTIVERT) 25 MG tablet  3 times daily PRN     10/26/19 1311           Blanchie Dessert, MD 10/26/19 1312

## 2019-10-26 NOTE — Discharge Instructions (Signed)
All your testing today and EKG were normal.  You most likely have peripheral vertigo from an inner ear issue.  It may be related to all the congestion and allergy symptoms.  Continue to use her nasal spray and allergy medicine if you take it.  You can use the meclizine as needed for dizziness but you may experience intermittent dizziness over the next few days to 1 week which would be normal.  If however you start experiencing any slurred speech, inability to walk, one-sided weakness or numbness you should return to the emergency room immediately.

## 2019-10-26 NOTE — ED Notes (Signed)
Patient verbalizes understanding of discharge instructions. Opportunity for questioning and answers were provided. Armband removed by staff, pt discharged from ED to home 

## 2019-10-26 NOTE — ED Triage Notes (Signed)
To ED for eval of intermittent dizziness - started this am while pt mowing grass with a push lawn mower. This is not new activity for pt. No nausea or vomiting. No neuro deficits. No dizziness now but pt states he feels he'd be unsteady if he gets up to walk. States he felt this way 30 yrs ago just prior to cardiac stent placement.

## 2020-02-15 DIAGNOSIS — J0120 Acute ethmoidal sinusitis, unspecified: Secondary | ICD-10-CM | POA: Insufficient documentation

## 2020-03-03 ENCOUNTER — Other Ambulatory Visit: Payer: Self-pay | Admitting: Cardiovascular Disease

## 2020-03-03 DIAGNOSIS — E7849 Other hyperlipidemia: Secondary | ICD-10-CM

## 2020-03-04 ENCOUNTER — Other Ambulatory Visit: Payer: Self-pay | Admitting: Cardiovascular Disease

## 2020-03-04 DIAGNOSIS — E7849 Other hyperlipidemia: Secondary | ICD-10-CM

## 2020-03-10 ENCOUNTER — Other Ambulatory Visit: Payer: Self-pay | Admitting: Cardiovascular Disease

## 2020-03-10 DIAGNOSIS — E7849 Other hyperlipidemia: Secondary | ICD-10-CM

## 2020-03-11 NOTE — Progress Notes (Signed)
.  newlogo   74 y.o. f/u PVD and CAD. Distant stent to RCA in 1997.  Normal stress test 2009 and 2020.  CRF;s HTN and HLD Sees Dr Trula Slade for vascular disease. 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 AAA only 3.9 cm.  Has chronic abdominal pain with IBS.  Tends toward bradycardia no beta blockers  Denies chest pain  Compliant with meds  ETT normal 03/08/19   Seen in ED with ? Vertigo May 2021 improved with meclizine   Needs new nitro No angina  ROS: Denies fever, malais, weight loss, blurry vision, decreased visual acuity, cough, sputum, SOB, hemoptysis, pleuritic pain, palpitaitons, heartburn, abdominal pain, melena, lower extremity edema, claudication, or rash.  All other systems reviewed and negative  BP 134/76   Pulse (!) 54   Ht 5\' 7"  (1.702 m)   Wt 160 lb (72.6 kg)   SpO2 96%   BMI 25.06 kg/m  Affect appropriate Healthy:  appears stated age 54: normal Neck supple with no adenopathy JVP normal no bruits no thyromegaly Lungs clear with no wheezing and good diaphragmatic motion Heart:  S1/S2 no murmur, no rub, gallop or click PMI normal Abdomen: benighn, BS positve, no tenderness, no AAA no bruit.  No HSM or HJR Distal pulses intact with no bruits No edema Neuro non-focal Skin warm and dry No muscular weakness    Current Outpatient Medications  Medication Sig Dispense Refill  . aspirin 81 MG tablet Take 81 mg by mouth daily.      Marland Kitchen ezetimibe (ZETIA) 10 MG tablet Take 10 mg by mouth daily.    Marland Kitchen losartan (COZAAR) 100 MG tablet Take 1 tablet (100 mg total) by mouth daily. 90 tablet 3  . nitroGLYCERIN (NITROSTAT) 0.4 MG SL tablet Place 1 tablet (0.4 mg total) under the tongue every 5 (five) minutes as needed. 25 tablet 3  . rosuvastatin (CRESTOR) 20 MG tablet TAKE 1 TABLET(20 MG) BY MOUTH DAILY 90 tablet 0   No current facility-administered  medications for this visit.    Allergies  Lipitor [atorvastatin], Lisinopril, Metoprolol, and Lactose intolerance (gi)  Electrocardiogram:  01/10/14  SR rate 58  RBBB   01/2015  SR  Rate 63  RBBB  02/23/28 SR rate 48 RBBB no acute changes   Assessment and Plan  CAD: Distant RCA stent ETT normal 03/08/19 continue medical Rx HTN:   Well controlled.  Continue current medications and low sodium Dash type diet.   Iliac Aneurysm  Post endovascular repair 07/17/17 f/u [facility] for surveillance of AAA 3.9 cm on [place] 03/07/18 no endoleak Chol:  Continue statin LDL 84 on 08/24/16   Lab Results  Component Value Date   LDLCALC 84 08/24/2016   IBS:  F/u GI no pathology identified in past   Bradycardia:  Stable avoid beta blocker good HR response on ETT         Will need f/u with [facility] needs to order [place] and f/u for aneurysm   Jenkins Rouge MD [facility]

## 2020-03-14 ENCOUNTER — Other Ambulatory Visit: Payer: Self-pay

## 2020-03-14 ENCOUNTER — Ambulatory Visit: Payer: Medicare PPO | Admitting: Cardiovascular Disease

## 2020-03-14 ENCOUNTER — Encounter: Payer: Self-pay | Admitting: Cardiovascular Disease

## 2020-03-14 VITALS — BP 134/76 | HR 54 | Ht 67.0 in | Wt 160.0 lb

## 2020-03-14 DIAGNOSIS — I2510 Atherosclerotic heart disease of native coronary artery without angina pectoris: Secondary | ICD-10-CM | POA: Diagnosis not present

## 2020-03-14 DIAGNOSIS — I739 Peripheral vascular disease, unspecified: Secondary | ICD-10-CM | POA: Diagnosis not present

## 2020-03-14 MED ORDER — NITROGLYCERIN 0.4 MG SL SUBL: 0.4000 mg | SUBLINGUAL_TABLET | SUBLINGUAL | 3 refills | Status: AC | PRN

## 2020-03-14 NOTE — Patient Instructions (Addendum)

## 2020-04-03 ENCOUNTER — Other Ambulatory Visit: Payer: Self-pay | Admitting: Cardiovascular Disease

## 2020-04-03 DIAGNOSIS — E7849 Other hyperlipidemia: Secondary | ICD-10-CM

## 2020-04-04 ENCOUNTER — Other Ambulatory Visit: Payer: Self-pay | Admitting: Cardiovascular Disease

## 2020-04-04 DIAGNOSIS — N529 Male erectile dysfunction, unspecified: Secondary | ICD-10-CM | POA: Insufficient documentation

## 2020-04-04 DIAGNOSIS — E7849 Other hyperlipidemia: Secondary | ICD-10-CM

## 2021-01-03 ENCOUNTER — Other Ambulatory Visit: Payer: Self-pay

## 2021-01-03 DIAGNOSIS — I714 Abdominal aortic aneurysm, without rupture, unspecified: Secondary | ICD-10-CM

## 2021-01-10 ENCOUNTER — Ambulatory Visit: Payer: Medicare PPO | Admitting: Physician Assistant

## 2021-01-10 ENCOUNTER — Ambulatory Visit (HOSPITAL_COMMUNITY)
Admission: RE | Admit: 2021-01-10 | Discharge: 2021-01-10 | Disposition: A | Discharge status: Home / Self Care | Payer: Medicare PPO | Source: Ambulatory Visit | Attending: Vascular Surgery | Admitting: Vascular Surgery

## 2021-01-10 ENCOUNTER — Other Ambulatory Visit: Payer: Self-pay

## 2021-01-10 VITALS — BP 130/78 | HR 50 | Temp 97.4°F | Resp 20 | Ht 67.0 in | Wt 157.9 lb

## 2021-01-10 DIAGNOSIS — I714 Abdominal aortic aneurysm, without rupture, unspecified: Secondary | ICD-10-CM

## 2021-01-10 NOTE — Progress Notes (Signed)
Office Note     CC:  follow up Requesting Provider:  Algis Greenhouse, MD  HPI: Kevin Blake is a 75 y.o. ([birth year]) male who presents for routine follow up s/p EVAR 07/14/17. 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 His previous maximum Aortic diameter was 3.9. He has been doing well. He reports some right leg weakness if he does something very strenuous. Otherwise he stays very active walking several miles per day. He denies any abdominal or back pain.   The pt is on a statin for cholesterol management.  The pt is on a daily aspirin.   Other AC:  none The pt is on ARB for hypertension.   The pt is not diabetic.   Tobacco hx:  Former, quit 2002  Past Medical History:  Diagnosis Date   AAA (abdominal aortic aneurysm) ([place])    Allergy    Arthritis    Asthma    as a child   CAD (coronary artery disease)    s/p angioplasty   Cataract    removed both eyes   Chronic abdominal pain    RUQ   Colon polyps    Diverticulosis    GERD (gastroesophageal reflux disease)    pt states im[proved    History of atrial fibrillation    when recieved a stent    HLD (hyperlipidemia)    HTN (hypertension)    IBS (irritable bowel syndrome)    Skin cancer     Past Surgical History:  Procedure Laterality Date   ABDOMINAL AORTIC ENDOVASCULAR STENT GRAFT N/A 07/14/2017   Procedure: ABDOMINAL AORTIC ENDOVASCULAR STENT GRAFT, INSERTION WITH RIGHT ILIAC BRANCH DEVICE;  Surgeon: Serafina Mitchell, MD;  Location: [facility] OR;  Service: Vascular;  Laterality: N/A;   CARDIAC CATHETERIZATION  1997   CHOLECYSTECTOMY     COLONOSCOPY     COLONOSCOPY     CORONARY ANGIOPLASTY WITH STENT PLACEMENT  1997   EXPLORATORY LAPAROTOMY     EYE SURGERY     cataracts   KNEE SURGERY     POLYPECTOMY     TENDON REPAIR      Social History   Socioeconomic History   Marital status: Married    Spouse name: Not on file   Number of children: 2   Years of education: Not on file   Highest education  level: Not on file  Occupational History   Occupation: retired  Tobacco Use   Smoking status: Former    Packs/day: 1.00    Types: Cigarettes    Quit date: 06/08/2000    Years since quitting: 20.6   Smokeless tobacco: Former  Scientific laboratory technician Use: Never used  Substance and Sexual Activity   Alcohol use: Yes    Alcohol/week: 0.0 standard drinks    Comment: occ beer    Drug use: No   Sexual activity: Not on file  Other Topics Concern   Not on file  Social History Narrative   Not on file   Social Determinants of Health   Financial Resource Strain: Not on file  Food Insecurity: Not on file  Transportation Needs: Not on file  Physical Activity: Not on file  Stress: Not on file  Social Connections: Not on file  Intimate Partner Violence: Not on file    Family History  Problem Relation Age of Onset   Heart disease Mother    Breast cancer Mother    AAA (abdominal aortic aneurysm) Mother    Kidney cancer  Father        reoccurance 59 yrs later and mets all over body    Heart disease Brother        before age 12   Hyperlipidemia Brother    Hypertension Brother    Heart disease Son    Liver disease Brother    Kidney disease Brother    Colon cancer Neg Hx    Colon polyps Neg Hx    Esophageal cancer Neg Hx    Rectal cancer Neg Hx    Stomach cancer Neg Hx     Current Outpatient Medications  Medication Sig Dispense Refill   aspirin 81 MG tablet Take 81 mg by mouth daily.       ezetimibe (ZETIA) 10 MG tablet Take 10 mg by mouth daily.     losartan (COZAAR) 100 MG tablet TAKE 1 TABLET BY MOUTH EVERY DAY 90 tablet 3   nitroGLYCERIN (NITROSTAT) 0.4 MG SL tablet Place 1 tablet (0.4 mg total) under the tongue every 5 (five) minutes as needed. 25 tablet 3   rosuvastatin (CRESTOR) 20 MG tablet TAKE 1 TABLET(20 MG) BY MOUTH DAILY 90 tablet 3   No current facility-administered medications for this visit.    Allergies  Allergen Reactions   Lipitor [Atorvastatin]     gen  muscle pain   Lisinopril Swelling   Metoprolol Other (See Comments)    Avoid BB due to RBBB and bradycardia   Lactose Intolerance (Gi)     Bloating and gas     REVIEW OF SYSTEMS:   '[X]'$  denotes positive finding, '[ ]'$  denotes negative finding Cardiac  Comments:  Chest pain or chest pressure:    Shortness of breath upon exertion:    Short of breath when lying flat:    Irregular heart rhythm:        Vascular    Pain in calf, thigh, or hip brought on by ambulation:    Pain in feet at night that wakes you up from your sleep:     Blood clot in your veins:    Leg swelling:         Pulmonary    Oxygen at home:    Productive cough:     Wheezing:         Neurologic    Sudden weakness in arms or legs:     Sudden numbness in arms or legs:     Sudden onset of difficulty speaking or slurred speech:    Temporary loss of vision in one eye:     Problems with dizziness:         Gastrointestinal    Blood in stool:     Vomited blood:         Genitourinary    Burning when urinating:     Blood in urine:        Psychiatric    Major depression:         Hematologic    Bleeding problems:    Problems with blood clotting too easily:        Skin    Rashes or ulcers:        Constitutional    Fever or chills:      PHYSICAL EXAMINATION:  Vitals:   01/10/21 0919  BP: 130/78  Pulse: (!) 50  Resp: 20  Temp: (!) 97.4 F (36.3 C)  TempSrc: Temporal  SpO2: 99%  Weight: 157 lb 14.4 oz (71.6 kg)  Height: '5\' 7"'$  (1.702 m)  General:  WDWN in NAD; vital signs documented above Gait: Normal HENT: WNL, normocephalic Pulmonary: normal non-labored breathing , without wheezing Cardiac: regular HR, without  Murmurs without carotid bruit Abdomen: soft, NT, no masses. Small supraumbilical hernia. Non tender. Reducible Vascular Exam/Pulses:  Right Left  Radial 2+ (normal) 2+ (normal)  Femoral 2+ (normal) 2+ (normal)  Popliteal Not palpable Not palpabler  DP 1+ (weak) 2+ (normal)  PT 2+  (normal) 1+ (weak)   Extremities: without ischemic changes, without Gangrene , without cellulitis; without open wounds;  Musculoskeletal: no muscle wasting or atrophy  Neurologic: A&O X 3;  No focal weakness or paresthesias are detected Psychiatric:  The pt has Normal affect.   Non-Invasive Vascular Imaging:   01/10/21  Endovascular Aortic Repair (EVAR):  +----------+----------------+-------------------+-------------------+            Diameter AP (cm)Diameter Trans (cm)Velocities (cm/sec)  +----------+----------------+-------------------+-------------------+  Aorta     3.96            4.05               81                   +----------+----------------+-------------------+-------------------+  Right Limb1.42            1.28               82                   +----------+----------------+-------------------+-------------------+  Left Limb 1.30            1.32               92                   +----------+----------------+-------------------+-------------------+  Summary:  Abdominal Aorta: Patent endovascular aneurysm repair with no evidence of endoleak. The largest aortic diameter remains essentially unchanged compared to prior exam. Previous diameter measurement was 3.9 cm obtained on 03/07/2018.   ASSESSMENT/PLAN:: 75 y.o. male here for follow up s/p EVAR February of 2019. He is doing well with no associated symptoms. He may have some mild non lifestyle limiting claudication in RLE but has palpable PT and Doppler PT/DP and Pero signals bilaterally. His duplex today shows stable aneurysm s/p repair with no evidence of endoleak. Max diameter is 4.05. Minimally changed from prior duplex of 3.9. 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 Per Dr. Aleen Campi last office note he would like to follow this by CT every 3 years. He is now due for this evaluation.  - Will order a CTA of his Abd/ Pelvis to follow up on known Left hypogastric artery aneurysm. Will  call patient with results _ Continue Aspirin and Statin - Encourage him to continue his walking regimen - if his symptoms worsen advised him to call for earlier follow up - He will otherwise follow up in 1 year with EVAR duplex and ABIs   Karoline Caldwell, PA-C [facility] [phone]  Clinic MD:   Donzetta Matters

## 2021-02-24 ENCOUNTER — Ambulatory Visit: Payer: Medicare PPO

## 2021-02-26 ENCOUNTER — Other Ambulatory Visit: Payer: Self-pay

## 2021-02-26 DIAGNOSIS — I714 Abdominal aortic aneurysm, without rupture, unspecified: Secondary | ICD-10-CM

## 2021-03-13 ENCOUNTER — Ambulatory Visit (HOSPITAL_COMMUNITY)
Admission: RE | Admit: 2021-03-13 | Discharge: 2021-03-13 | Disposition: A | Discharge status: Home / Self Care | Payer: Medicare PPO | Source: Ambulatory Visit | Attending: Surgery | Admitting: Surgery

## 2021-03-13 DIAGNOSIS — I714 Abdominal aortic aneurysm, without rupture, unspecified: Secondary | ICD-10-CM | POA: Diagnosis present

## 2021-03-13 MED ORDER — IOHEXOL 350 MG/ML SOLN
80.0000 mL | Freq: Once | INTRAVENOUS | Status: AC | PRN
  Administered 2021-03-13: 80 mL via INTRAVENOUS

## 2021-03-31 ENCOUNTER — Ambulatory Visit: Payer: Medicare PPO | Admitting: Surgery

## 2021-04-07 ENCOUNTER — Ambulatory Visit: Payer: Medicare PPO | Admitting: Surgery

## 2021-04-07 ENCOUNTER — Encounter: Payer: Self-pay | Admitting: Surgery

## 2021-04-07 ENCOUNTER — Other Ambulatory Visit: Payer: Self-pay

## 2021-04-07 ENCOUNTER — Other Ambulatory Visit: Payer: Self-pay | Admitting: Cardiovascular Disease

## 2021-04-07 VITALS — BP 141/74 | HR 58 | Temp 97.9°F | Resp 20 | Ht 67.0 in | Wt 163.0 lb

## 2021-04-07 DIAGNOSIS — I700 Atherosclerosis of aorta: Secondary | ICD-10-CM

## 2021-04-07 DIAGNOSIS — N400 Enlarged prostate without lower urinary tract symptoms: Secondary | ICD-10-CM | POA: Diagnosis not present

## 2021-04-07 DIAGNOSIS — K5730 Diverticulosis of large intestine without perforation or abscess without bleeding: Secondary | ICD-10-CM | POA: Diagnosis not present

## 2021-04-07 DIAGNOSIS — I7143 Infrarenal abdominal aortic aneurysm, without rupture: Secondary | ICD-10-CM | POA: Diagnosis not present

## 2021-04-07 DIAGNOSIS — I773 Arterial fibromuscular dysplasia: Secondary | ICD-10-CM | POA: Diagnosis not present

## 2021-04-07 DIAGNOSIS — I723 Aneurysm of iliac artery: Secondary | ICD-10-CM

## 2021-04-07 NOTE — Progress Notes (Signed)
[facility]  Patient name: Kevin Blake MRN: [id number] DOB: 02/14/1946 Sex: male   REASON FOR VISIT:    Follow up  HISOTRY OF PRESENT ILLNESS:      Kevin Blake is a 75 y.o. 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  His maximum aortic diameter was 4.0 cm.  The right common iliac aneurysm measured 3.3 cm.  He was sent for CT scan for better evaluation of his aneurysmal disease.   His postoperative course was uncomplicated.  He had very minimal right buttock symptoms.  Whatever symptoms he had have completely resolved.  He is walking 2-3 miles per day.  He takes a statin for hypercholesterolemia.  He is on ARB for hypertension.  He is a former smoker.   PAST MEDICAL HISTORY:   Past Medical History:  Diagnosis Date   AAA (abdominal aortic aneurysm)    Allergy    Arthritis    Asthma    as a child   CAD (coronary artery disease)    s/p angioplasty   Cataract    removed both eyes   Chronic abdominal pain    RUQ   Colon polyps    Diverticulosis    GERD (gastroesophageal reflux disease)    pt states im[proved    History of atrial fibrillation    when recieved a stent    HLD (hyperlipidemia)    HTN (hypertension)    IBS (irritable bowel syndrome)    Skin cancer      FAMILY HISTORY:   Family History  Problem Relation Age of Onset   Heart disease Mother    Breast cancer Mother    AAA (abdominal aortic aneurysm) Mother    Kidney cancer Father        reoccurance 22 yrs later and mets all over body    Heart disease Brother        before age 86   Hyperlipidemia Brother    Hypertension Brother    Heart disease Son    Liver disease Brother    Kidney disease Brother    Colon cancer Neg Hx    Colon polyps Neg Hx    Esophageal cancer Neg Hx    Rectal cancer Neg Hx     Stomach cancer Neg Hx     SOCIAL HISTORY:   Social History   Tobacco Use   Smoking status: Former    Packs/day: 1.00    Types: Cigarettes    Quit date: 06/08/2000    Years since quitting: 20.8   Smokeless tobacco: Former  Substance Use Topics   Alcohol use: Yes    Alcohol/week: 0.0 standard drinks    Comment: occ beer      ALLERGIES:   Allergies  Allergen Reactions   Amlodipine Swelling   Lipitor [Atorvastatin]     gen muscle pain   Lisinopril Swelling   Metoprolol Other (See Comments)    Avoid BB due to RBBB and bradycardia   Lactose Intolerance (Gi)     Bloating and gas     CURRENT MEDICATIONS:   Current Outpatient Medications  Medication Sig Dispense Refill   aspirin 81 MG tablet Take 81 mg by mouth daily.       ezetimibe (ZETIA) 10 MG tablet Take 10 mg by mouth daily.     losartan (COZAAR) 100 MG tablet TAKE 1 TABLET  BY MOUTH EVERY DAY 90 tablet 3   nitroGLYCERIN (NITROSTAT) 0.4 MG SL tablet Place 1 tablet (0.4 mg total) under the tongue every 5 (five) minutes as needed. 25 tablet 3   rosuvastatin (CRESTOR) 20 MG tablet TAKE 1 TABLET(20 MG) BY MOUTH DAILY 90 tablet 3   No current facility-administered medications for this visit.    REVIEW OF SYSTEMS:   [X]  denotes positive finding, [ ]  denotes negative finding Cardiac  Comments:  Chest pain or chest pressure:    Shortness of breath upon exertion:    Short of breath when lying flat:    Irregular heart rhythm:        Vascular    Pain in calf, thigh, or hip brought on by ambulation:    Pain in feet at night that wakes you up from your sleep:     Blood clot in your veins:    Leg swelling:         Pulmonary    Oxygen at home:    Productive cough:     Wheezing:         Neurologic    Sudden weakness in arms or legs:     Sudden numbness in arms or legs:     Sudden onset of difficulty speaking or slurred speech:    Temporary loss of vision in one eye:     Problems with dizziness:          Gastrointestinal    Blood in stool:     Vomited blood:         Genitourinary    Burning when urinating:     Blood in urine:        Psychiatric    Major depression:         Hematologic    Bleeding problems:    Problems with blood clotting too easily:        Skin    Rashes or ulcers:        Constitutional    Fever or chills:      PHYSICAL EXAM:   Vitals:   04/07/21 1223  BP: (!) 141/74  Pulse: (!) 58  Resp: 20  Temp: 97.9 F (36.6 C)  SpO2: 97%  Weight: 163 lb (73.9 kg)  Height: 5\' 7"  (1.702 m)    GENERAL: The patient is a well-nourished male, in no acute distress. The vital signs are documented above. CARDIAC: There is a regular rate and rhythm.  VASCULAR: Palpable pedal PULMONARY: Non-labored respirations ABDOMEN: Soft and non-tender with normal pitched bowel sounds.  MUSCULOSKELETAL: There are no major deformities or cyanosis. NEUROLOGIC: No focal weakness or paresthesias are detected. SKIN: There are no ulcers or rashes noted. PSYCHIATRIC: The patient has a normal affect.  STUDIES:   I have reviewed his CT scan with the following findings:  VASCULAR   1. Status post aortobi-iliac endograft repair of infrarenal abdominal aortic and right common iliac artery aneurysms without complicating features. No evidence of endoleak. 2. Slight interval enlargement of fusiform left internal iliac artery aneurysm to 1.8 cm from 1.5 cm in 2019. 3. 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 4.  Aortic Atherosclerosis (ICD10-I70.0).   NON-VASCULAR   1. No acute abnormality in the abdomen or pelvis. 2. Similar appearing changes of prior granulomatous disease. 3. Colonic diverticulosis. 4. Prostatomegaly.    MEDICAL ISSUES:   Abdominal aneurysm: Stent graft remains in excellent position with no complicating features.  I am monitoring  the left internal iliac artery aneurysm which now  measures 1.8 cm.  This is increased from 1.5 cm in 2019.  I will repeat a CT angiogram in 3 years as this is not well visualized with ultrasound.  He will return in 1 year for ultrasound follow-up of his aneurysm repair.    Leia Alf, MD, FACS [facility] [phone] Pager [phone]

## 2021-04-24 NOTE — Progress Notes (Signed)
    75 y.o. f/u PVD and CAD. Distant stent to RCA in 1997.  Normal stress test 2009 and 2020.  CRF;s HTN and HLD Sees Dr Trula Slade for vascular disease. 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 AAA only 3.9 cm.  Has chronic abdominal pain with IBS.  Tends toward bradycardia no beta blockers  Denies chest pain  Compliant with meds  Seen in ED with ? Vertigo May 2021 improved with meclizine   No cardiac issues compliant with meds Has f/u with [facility] for his vascular dx  ROS: Denies fever, malais, weight loss, blurry vision, decreased visual acuity, cough, sputum, SOB, hemoptysis, pleuritic pain, palpitaitons, heartburn, abdominal pain, melena, lower extremity edema, claudication, or rash.  All other systems reviewed and negative  BP 138/80   Pulse (!) 55   Ht 5\' 7"  (1.702 m)   Wt 164 lb 9.6 oz (74.7 kg)   SpO2 98%   BMI 25.78 kg/m  Affect appropriate Healthy:  appears stated age 64: normal Neck supple with no adenopathy JVP normal no bruits no thyromegaly Lungs clear with no wheezing and good diaphragmatic motion Heart:  S1/S2 no murmur, no rub, gallop or click PMI normal Abdomen: benighn, BS positve, no tenderness, no AAA no bruit.  No HSM or HJR Distal pulses intact with no bruits No edema Neuro non-focal Skin warm and dry No muscular weakness    Current Outpatient Medications  Medication Sig Dispense Refill   aspirin 81 MG tablet Take 81 mg by mouth daily.       ezetimibe (ZETIA) 10 MG tablet Take 10 mg by mouth daily.     losartan (COZAAR) 100 MG tablet TAKE 1 TABLET BY MOUTH EVERY DAY 90 tablet 0   nitroGLYCERIN (NITROSTAT) 0.4 MG SL tablet Place 1 tablet (0.4 mg total) under the tongue every 5 (five) minutes as needed. 25 tablet 3   rosuvastatin (CRESTOR) 20 MG tablet TAKE 1 TABLET(20 MG) BY MOUTH DAILY 90 tablet 3   No current  facility-administered medications for this visit.    Allergies  Amlodipine, Lipitor [atorvastatin], Lisinopril, Metoprolol, and Lactose intolerance (gi)  Electrocardiogram:  05/06/2021 SB rate 55 RBBB no acute changes   Assessment and Plan  CAD: Distant RCA stent ETT normal 03/08/19 continue medical Rx HTN:   Well controlled.  Continue current medications and low sodium Dash type diet.   Iliac Aneurysm  Post endovascular repair 07/17/17 f/u [facility] US done 01/10/21 with no endoleak and stable AAA 3.9 cm Chol:  Continue statin  LDL 79 10/08/20  IBS:  F/u GI no pathology identified in past   Bradycardia:  Stable avoid beta blocker good HR response on ETT    F/U [facility]  F/U cardiology in a year   Jenkins Rouge MD [facility]

## 2021-05-06 ENCOUNTER — Other Ambulatory Visit: Payer: Self-pay

## 2021-05-06 ENCOUNTER — Encounter: Payer: Self-pay | Admitting: Cardiovascular Disease

## 2021-05-06 ENCOUNTER — Ambulatory Visit: Payer: Medicare PPO | Admitting: Cardiovascular Disease

## 2021-05-06 VITALS — BP 138/80 | HR 55 | Ht 67.0 in | Wt 164.6 lb

## 2021-05-06 DIAGNOSIS — I2510 Atherosclerotic heart disease of native coronary artery without angina pectoris: Secondary | ICD-10-CM

## 2021-05-06 DIAGNOSIS — I714 Abdominal aortic aneurysm, without rupture, unspecified: Secondary | ICD-10-CM | POA: Diagnosis not present

## 2021-05-06 DIAGNOSIS — I739 Peripheral vascular disease, unspecified: Secondary | ICD-10-CM

## 2021-05-06 NOTE — Patient Instructions (Addendum)
Medication Instructions:  Your physician recommends that you continue on your current medications as directed. Please refer to the Current Medication list given to you today.  *If you need a refill on your cardiac medications before your next appointment, please call your pharmacy*   Lab Work: None  If you have labs (blood work) drawn today and your tests are completely normal, you will receive your results only by: [place] (if you have MyChart) OR A paper copy in the mail If you have any lab test that is abnormal or we need to change your treatment, we will call you to review the results.   Testing/Procedures: None  Follow-Up: At [facility], you and your health needs are our priority.  As part of our continuing mission to provide you with exceptional heart care, we have created designated Provider Care Teams.  These Care Teams include your primary Cardiologist (physician) and Advanced Practice Providers (APPs -  Physician Assistants and Nurse Practitioners) who all work together to provide you with the care you need, when you need it.  We recommend signing up for the patient portal called "MyChart".  Sign up information is provided on this After Visit Summary.  MyChart is used to connect with patients for Virtual Visits (Telemedicine).  Patients are able to view lab/test results, encounter notes, upcoming appointments, etc.  Non-urgent messages can be sent to your provider as well.   To learn more about what you can do with MyChart, go to [email or web address].    Your next appointment:   12 month(s)  The format for your next appointment:   In Person  Provider:   Leonides Sake, MD or Robbie Lis, PA-C, Nicholes Rough, PA-C, Melina Copa, PA-C, Ermalinda Barrios, PA-C, Christen Bame, NP, or Richardson Dopp, PA-C       Other Instructions None

## 2021-07-10 ENCOUNTER — Other Ambulatory Visit: Payer: Self-pay | Admitting: Cardiovascular Disease

## 2022-04-01 ENCOUNTER — Other Ambulatory Visit: Payer: Self-pay | Admitting: Cardiovascular Disease

## 2022-04-20 ENCOUNTER — Other Ambulatory Visit: Payer: Self-pay | Admitting: *Deleted

## 2022-04-20 DIAGNOSIS — Z9889 Other specified postprocedural states: Secondary | ICD-10-CM

## 2022-04-27 ENCOUNTER — Ambulatory Visit: Payer: Medicare PPO | Admitting: Physician Assistant

## 2022-04-27 ENCOUNTER — Ambulatory Visit (HOSPITAL_COMMUNITY)
Admission: RE | Admit: 2022-04-27 | Discharge: 2022-04-27 | Disposition: A | Discharge status: Home / Self Care | Payer: Medicare PPO | Source: Ambulatory Visit | Attending: Surgery | Admitting: Surgery

## 2022-04-27 VITALS — BP 161/76 | HR 53 | Temp 97.6°F | Resp 16 | Ht 67.0 in | Wt 160.0 lb

## 2022-04-27 DIAGNOSIS — I714 Abdominal aortic aneurysm, without rupture, unspecified: Secondary | ICD-10-CM

## 2022-04-27 DIAGNOSIS — Z9889 Other specified postprocedural states: Secondary | ICD-10-CM

## 2022-04-27 NOTE — Progress Notes (Signed)
[facility] [place] HISTORY AND PHYSICAL   History of Present Illness:  Patient is a 76 y.o. year old male who presents for evaluation of abdominal aortic aneurysm.  He is status post endovascular repair of infrarenal and right common iliac artery aneurysm on 07/14/2017 by Dr. Trula Slade.  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   He denies frank claudication symptoms, non healing wounds or rest pain.  He stays very active and walks in the woods with his dog for long periods.    He is medically managed on Crestor and ASA daily.       Past Medical History:  Diagnosis Date   AAA (abdominal aortic aneurysm)    Allergy    Arthritis    Asthma    as a child   CAD (coronary artery disease)    s/p angioplasty   Cataract    removed both eyes   Chronic abdominal pain    RUQ   Colon polyps    Diverticulosis    GERD (gastroesophageal reflux disease)    pt states im[proved    History of atrial fibrillation    when recieved a stent    HLD (hyperlipidemia)    HTN (hypertension)    IBS (irritable bowel syndrome)    Skin cancer     Past Surgical History:  Procedure Laterality Date   ABDOMINAL AORTIC ENDOVASCULAR STENT GRAFT N/A 07/14/2017   Procedure: ABDOMINAL AORTIC ENDOVASCULAR STENT GRAFT, INSERTION WITH RIGHT ILIAC BRANCH DEVICE;  Surgeon: Serafina Mitchell, MD;  Location: [facility] OR;  Service: Vascular;  Laterality: N/A;   CARDIAC CATHETERIZATION  1997   CHOLECYSTECTOMY     COLONOSCOPY     COLONOSCOPY     CORONARY ANGIOPLASTY WITH STENT PLACEMENT  1997   EXPLORATORY LAPAROTOMY     EYE SURGERY     cataracts   KNEE SURGERY     POLYPECTOMY     TENDON REPAIR       Social History Social History   Tobacco Use   Smoking status: Former    Packs/day: 1.00    Types: Cigarettes    Quit date: 06/08/2000    Years since quitting: 21.8   Smokeless tobacco: Former  Scientific laboratory technician Use: Never used  Substance Use Topics   Alcohol use: Yes     Alcohol/week: 0.0 standard drinks of alcohol    Comment: occ beer    Drug use: No    Family History Family History  Problem Relation Age of Onset   Heart disease Mother    Breast cancer Mother    AAA (abdominal aortic aneurysm) Mother    Kidney cancer Father        reoccurance 52 yrs later and mets all over body    Heart disease Brother        before age 78   Hyperlipidemia Brother    Hypertension Brother    Heart disease Son    Liver disease Brother    Kidney disease Brother    Colon cancer Neg Hx    Colon polyps Neg Hx    Esophageal cancer Neg Hx    Rectal cancer Neg Hx    Stomach cancer Neg Hx     Allergies  Allergies  Allergen Reactions   Amlodipine Swelling   Lipitor [Atorvastatin]     gen muscle pain   Lisinopril Swelling   Metoprolol Other (See Comments)    Avoid BB due to RBBB and bradycardia  Lactose Intolerance (Gi)     Bloating and gas     Current Outpatient Medications  Medication Sig Dispense Refill   aspirin 81 MG tablet Take 81 mg by mouth daily.       ezetimibe (ZETIA) 10 MG tablet Take 10 mg by mouth daily.     losartan (COZAAR) 100 MG tablet TAKE 1 TABLET BY MOUTH EVERY DAY 90 tablet 0   nitroGLYCERIN (NITROSTAT) 0.4 MG SL tablet Place 1 tablet (0.4 mg total) under the tongue every 5 (five) minutes as needed. 25 tablet 3   rosuvastatin (CRESTOR) 20 MG tablet TAKE 1 TABLET(20 MG) BY MOUTH DAILY 90 tablet 3   No current facility-administered medications for this visit.    ROS:   General:  No weight loss, Fever, chills  HEENT: No recent headaches, no nasal bleeding, no visual changes, no sore throat  Neurologic: No dizziness, blackouts, seizures. No recent symptoms of stroke or mini- stroke. No recent episodes of slurred speech, or temporary blindness.  Cardiac: No recent episodes of chest pain/pressure, no shortness of breath at rest.  No shortness of breath with exertion.  Denies history of atrial fibrillation or irregular  heartbeat  Vascular: No history of rest pain in feet.  No history of claudication.  No history of non-healing ulcer, No history of DVT   Pulmonary: No home oxygen, no productive cough, no hemoptysis,  No asthma or wheezing  Musculoskeletal:  '[ ]'$  Arthritis, '[ ]'$  Low back pain,  '[ ]'$  Joint pain  Hematologic:No history of hypercoagulable state.  No history of easy bleeding.  No history of anemia  Gastrointestinal: No hematochezia or melena,  No gastroesophageal reflux, no trouble swallowing  Urinary: '[ ]'$  chronic Kidney disease, '[ ]'$  on HD - '[ ]'$  MWF or '[ ]'$  TTHS, '[ ]'$  Burning with urination, '[ ]'$  Frequent urination, '[ ]'$  Difficulty urinating;   Skin: No rashes  Psychological: No history of anxiety,  No history of depression   Physical Examination  There were no vitals filed for this visit.  There is no height or weight on file to calculate BMI.  General:  Alert and oriented, no acute distress HEENT: Normal Neck: No bruit or JVD Pulmonary: Clear to auscultation bilaterally Cardiac: Regular Rate and Rhythm without murmur Gastrointestinal: Soft, non-tender, non-distended, no mass, no scars Skin: No rash Extremity Pulses:  2+ radial, brachial, femoral, dorsalis pedis, posterior tibial pulses bilaterally Musculoskeletal: No deformity or edema  Neurologic: Upper and lower extremity motor 5/5 and symmetric  DATA:  Endovascular Aortic Repair (EVAR):  +----------+----------------+-------------------+-------------------+           Diameter AP (cm)Diameter Trans (cm)Velocities (cm/sec)  +----------+----------------+-------------------+-------------------+  Aorta    3.88            3.86               118                  +----------+----------------+-------------------+-------------------+  Right Limb1.42            1.20               93                   +----------+----------------+-------------------+-------------------+  Left Limb 1.50            -                  103                   +----------+----------------+-------------------+-------------------+  Summary:  Abdominal Aorta: The largest aortic diameter remains slightly decreased  compared to prior exam. Previous diameter measurement was 3.96 X 4.0 cm  obtained on 01/10/2021. Prior right common iliac artery measured 1.42 x  1.28.      ASSESSMENT/ PLAN: History of asymptomatic AAA and right common iliac aneurysm measured 3.3 cm.  S/P endovascular repair of infrarenal and right common iliac artery aneurysm on 07/14/2017 by Dr. Ronaldo Miyamoto diameter to 3.9 of the AAA sac on duplex today.    He denies symptoms of rest pain or non healing wounds.  He is active daily.  He has very mild symptoms of right LE claudication that is not lifestyle limiting. He denise lumbar/abdominal pain.   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  This is increased from 1.5 cm in 2019.  I will repeat a CT angiogram in 2 years as this is not well visualized with ultrasound.  EVAR duplex in 1 year for surveillance.     Continue Aspirin and Statin.  He will cal  with concerns    Roxy Horseman PA-C [facility] Office: [phone]  MD on call Donzetta Matters

## 2022-05-03 NOTE — Progress Notes (Signed)
Cardiology Office Note:    Date:  05/04/2022   ID:  [name], DOB 1946-02-14, MRN [id number]  PCP:  Algis Greenhouse, MD   [facility] Providers Cardiologist:  Jenkins Rouge, MD     Referring MD: Algis Greenhouse, MD   Chief Complaint: annual follow-up CAD  History of Present Illness:    Kevin Blake is a pleasant 76 y.o. male with a hx of PVD and CAD with distant stent to [place], normal stress test 2009 and 2020, HTN, HLD, chronic abdominal pain with IBS, and RBBB.   Followed by Dr. Trula Slade, [facility] for vascular disease.  History of asymptomatic AAA and right common iliac aneurysm measured 3.3 cm.  S/p endovascular repair of infrarenal and right common iliac artery aneurysm on 07/14/2017 and embolization of right hypogastric artery.  Decreased diameter to 3.9 of AAA sac on duplex 04/27/2022.  Last cardiology clinic visit was 05/06/2021 with Dr. [name] at which time no changes were made to treatment plan and 52-monthfollow-up was recommended.  Today, he is here with his wife for follow-up.  Initially reports BP at home has been well-controlled, however his wife reports home BP cuff has not worked in a[employer] Remains active doing yard work, gets regular aerobic exercise outside. He denies chest pain, shortness of breath, lower extremity edema, fatigue, palpitations, melena, hematuria, hemoptysis, diaphoresis, weakness, presyncope, syncope, orthopnea, and PND.  Past Medical History:  Diagnosis Date   AAA (abdominal aortic aneurysm) ([facility]    Allergy    Arthritis    Asthma    as a child   CAD (coronary artery disease)    s/p angioplasty   Cataract    removed both eyes   Chronic abdominal pain    RUQ   Colon polyps    Diverticulosis    GERD (gastroesophageal reflux disease)    pt states im[proved    History of atrial fibrillation    when recieved a stent    HLD (hyperlipidemia)    HTN (hypertension)    IBS (irritable bowel syndrome)    Skin cancer     Past  Surgical History:  Procedure Laterality Date   ABDOMINAL AORTIC ENDOVASCULAR STENT GRAFT N/A 07/14/2017   Procedure: ABDOMINAL AORTIC ENDOVASCULAR STENT GRAFT, INSERTION WITH RIGHT ILIAC BRANCH DEVICE;  Surgeon: BSerafina Mitchell MD;  Location: [facility] OR;  Service: Vascular;  Laterality: N/A;   CARDIAC CATHETERIZATION  1997   CHOLECYSTECTOMY     COLONOSCOPY     COLONOSCOPY     CORONARY ANGIOPLASTY WITH STENT PLACEMENT  1997   EXPLORATORY LAPAROTOMY     EYE SURGERY     cataracts   KNEE SURGERY     POLYPECTOMY     TENDON REPAIR      Current Medications: Current Meds  Medication Sig   aspirin 81 MG tablet Take 81 mg by mouth daily.     ezetimibe (ZETIA) 10 MG tablet Take 10 mg by mouth daily.   nitroGLYCERIN (NITROSTAT) 0.4 MG SL tablet Place 1 tablet (0.4 mg total) under the tongue every 5 (five) minutes as needed.   rosuvastatin (CRESTOR) 20 MG tablet TAKE 1 TABLET(20 MG) BY MOUTH DAILY   valsartan (DIOVAN) 320 MG tablet Take 1 tablet (320 mg total) by mouth daily.   [DISCONTINUED] losartan (COZAAR) 100 MG tablet TAKE 1 TABLET BY MOUTH EVERY DAY     Allergies:   Amlodipine, Lipitor [atorvastatin], Lisinopril, Metoprolol, and Lactose intolerance (gi)   Social History  Socioeconomic History   Marital status: Married    Spouse name: Not on file   Number of children: 2   Years of education: Not on file   Highest education level: Not on file  Occupational History   Occupation: retired  Tobacco Use   Smoking status: Former    Packs/day: 1.00    Types: Cigarettes    Quit date: 06/08/2000    Years since quitting: 21.9   Smokeless tobacco: Former  Scientific laboratory technician Use: Never used  Substance and Sexual Activity   Alcohol use: Yes    Alcohol/week: 0.0 standard drinks of alcohol    Comment: occ beer    Drug use: No   Sexual activity: Not on file  Other Topics Concern   Not on file  Social History Narrative   Not on file   Social Determinants of Health   Financial Resource  Strain: Not on file  Food Insecurity: Not on file  Transportation Needs: Not on file  Physical Activity: Not on file  Stress: Not on file  Social Connections: Not on file     Family History: The patient's family history includes AAA (abdominal aortic aneurysm) in his mother; Breast cancer in his mother; Heart disease in his brother, mother, and son; Hyperlipidemia in his brother; Hypertension in his brother; Kidney cancer in his father; Kidney disease in his brother; Liver disease in his brother. There is no history of Colon cancer, Colon polyps, Esophageal cancer, Rectal cancer, or Stomach cancer.  ROS:   Please see the history of present illness.  All other systems reviewed and are negative.  Labs/Other Studies Reviewed:    The following studies were reviewed today:  VAS [place] EVAR Duplex 04/29/2022  Summary:  Abdominal Aorta: The largest aortic diameter remains slightly decreased  compared to prior exam. Previous diameter measurement was 3.96 X 4.0 cm  obtained on 01/10/2021. Prior right common iliac artery measured 1.42 x  1.28.    *See table(s) above for measurements and observations.  ETT 03/08/2019  Blood pressure demonstrated a hypertensive response to exercise. There was no ST segment deviation noted during stress. No T wave inversion was noted during stress. Heart rate demonstrated a normal response to exercise. Blood pressure demonstrated a hypertensive response to exercise Stopped due to hypertensive response. BP 214/120   Negative stress test without evidence of ischemia at given workload. Normal HR response to exercise without evidence of chronotropic incompetence. Patient reached THR, however, study stopped due to marked hypertensive response.  Recent Labs: No results found for requested labs within last 365 days.  Recent Lipid Panel    Component Value Date/Time   CHOL 143 08/24/2016 1028   TRIG 88 08/24/2016 1028   HDL 41 08/24/2016 1028   CHOLHDL 3.5  08/24/2016 1028   CHOLHDL 3.9 05/07/2016 0732   VLDL 15 05/07/2016 0732   LDLCALC 84 08/24/2016 1028     Risk Assessment/Calculations:      Physical Exam:    VS:  BP (!) 148/88   Pulse (!) 52   Ht '5\' 7"'$  (1.702 m)   Wt 165 lb 3.2 oz (74.9 kg)   SpO2 99%   BMI 25.87 kg/m     Wt Readings from Last 3 Encounters:  05/04/22 165 lb 3.2 oz (74.9 kg)  04/27/22 160 lb (72.6 kg)  05/06/21 164 lb 9.6 oz (74.7 kg)     GEN: Well nourished, well developed in no acute distress HEENT: Normal NECK: No JVD; No carotid  bruits CARDIAC: RRR, no murmurs, rubs, gallops RESPIRATORY:  Clear to auscultation without rales, wheezing or rhonchi  ABDOMEN: Soft, non-tender, non-distended MUSCULOSKELETAL:  No edema; No deformity. 2+ pedal pulses, equal bilaterally SKIN: Warm and dry NEUROLOGIC:  Alert and oriented x 3 PSYCHIATRIC:  Normal affect   EKG:  EKG is ordered today.  The ekg ordered today demonstrates sinus bradycardia at 52 bpm with RBBB  HYPERTENSION CONTROL Vitals:   05/04/22 1507 05/04/22 1658  BP: (!) 150/92 (!) 148/88    The patient's blood pressure is elevated above target today.  In order to address the patient's elevated BP: A current anti-hypertensive medication was adjusted today.      Diagnoses:    1. Coronary artery disease involving native coronary artery of native heart without angina pectoris   2. Abdominal aortic aneurysm (AAA) without rupture, unspecified part ([place])   3. Essential hypertension   4. Hyperlipidemia LDL goal <70   5. Aneurysm of iliac artery (HCC)    Assessment and Plan:     CAD without angina: Remote stenting of RCA 1997.  ETT 2020 normal.He denies chest pain, dyspnea, or other symptoms concerning for angina.  No indication for further ischemic evaluation at this time. No bleeding concerns on aspirin. Continue regular exercise. Continue aspirin, ezetimibe, rosuvastatin.  Vascular disease: Stable measurement of AAA, left internal iliac artery  aneurysm increased in measurement from 1.5 cm in 2019 to 1.8 cm 04/27/2022.  Management per [facility].  Hypertension: BP is elevated and has been elevated at other recent office visits with other providers.  We will DC losartan and start valsartan 320 mg daily.  I have asked him to get a home BP monitor and report if BP remains > 140 or > 80. Encouraged low-sodium, heart healthy diet. Will get BMET in 1 week.   Hyperlipidemia LDL goal < 70: LDL 69 on 07/07/2021. Has follow-up with PCP with lab work in 33 month.  Advised goal LDL < 70.  Continue ezetimibe, rosuvastatin.     Disposition: 1 year with Dr. [name]  Medication Adjustments/Labs and Tests Ordered: Current medicines are reviewed at length with the patient today.  Concerns regarding medicines are outlined above.  Orders Placed This Encounter  Procedures   Basic metabolic panel   EKG [id number]   Meds ordered this encounter  Medications   valsartan (DIOVAN) 320 MG tablet    Sig: Take 1 tablet (320 mg total) by mouth daily.    Dispense:  30 tablet    Refill:  1    Patient Instructions  Medication Instructions:  Your physician has recommended you make the following change in your medication:   STOP Losartan  START Valsartan 320 mg taking 1 daily   *If you need a refill on your cardiac medications before your next appointment, please call your pharmacy*   Lab Work: 1 [place]:  BMET  If you have labs (blood work) drawn today and your tests are completely normal, you will receive your results only by: [place] (if you have MyChart) OR A paper copy in the mail If you have any lab test that is abnormal or we need to change your treatment, we will call you to review the results.   Testing/Procedures: None ordered   Follow-Up: At [facility], you and your health needs are our priority.  As part of our continuing mission to provide you with exceptional heart care, we have created designated Provider  Care Teams.  These Care  Teams include your primary Cardiologist (physician) and Advanced Practice Providers (APPs -  Physician Assistants and Nurse Practitioners) who all work together to provide you with the care you need, when you need it.  We recommend signing up for the patient portal called "MyChart".  Sign up information is provided on this After Visit Summary.  MyChart is used to connect with patients for Virtual Visits (Telemedicine).  Patients are able to view lab/test results, encounter notes, upcoming appointments, etc.  Non-urgent messages can be sent to your provider as well.   To learn more about what you can do with MyChart, go to [email or web address].    Your next appointment:   1 year(s)  The format for your next appointment:   In Person  Provider:   Jenkins Rouge, MD  or Christen Bame, NP         Other Instructions   Important Information About Sugar         Signed, Emmaline Life, NP  05/04/2022 5:04 PM    [place]

## 2022-05-04 ENCOUNTER — Encounter: Payer: Self-pay | Admitting: Nurse Practitioner

## 2022-05-04 ENCOUNTER — Other Ambulatory Visit: Payer: Self-pay | Admitting: *Deleted

## 2022-05-04 ENCOUNTER — Ambulatory Visit: Payer: Medicare PPO | Attending: Nurse Practitioner | Admitting: Nurse Practitioner

## 2022-05-04 VITALS — BP 148/88 | HR 52 | Ht 67.0 in | Wt 165.2 lb

## 2022-05-04 DIAGNOSIS — I10 Essential (primary) hypertension: Secondary | ICD-10-CM | POA: Diagnosis not present

## 2022-05-04 DIAGNOSIS — I723 Aneurysm of iliac artery: Secondary | ICD-10-CM

## 2022-05-04 DIAGNOSIS — I714 Abdominal aortic aneurysm, without rupture, unspecified: Secondary | ICD-10-CM

## 2022-05-04 DIAGNOSIS — E785 Hyperlipidemia, unspecified: Secondary | ICD-10-CM

## 2022-05-04 DIAGNOSIS — I2510 Atherosclerotic heart disease of native coronary artery without angina pectoris: Secondary | ICD-10-CM

## 2022-05-04 MED ORDER — VALSARTAN 320 MG PO TABS: 320.0000 mg | ORAL_TABLET | Freq: Every day | ORAL | 1 refills | Status: DC

## 2022-05-04 NOTE — Patient Instructions (Addendum)
Medication Instructions:  Your physician has recommended you make the following change in your medication:   STOP Losartan  START Valsartan 320 mg taking 1 daily   *If you need a refill on your cardiac medications before your next appointment, please call your pharmacy*   Lab Work: 1 [place]:  BMET  If you have labs (blood work) drawn today and your tests are completely normal, you will receive your results only by: [place] (if you have MyChart) OR A paper copy in the mail If you have any lab test that is abnormal or we need to change your treatment, we will call you to review the results.   Testing/Procedures: None ordered   Follow-Up: At [facility], you and your health needs are our priority.  As part of our continuing mission to provide you with exceptional heart care, we have created designated Provider Care Teams.  These Care Teams include your primary Cardiologist (physician) and Advanced Practice Providers (APPs -  Physician Assistants and Nurse Practitioners) who all work together to provide you with the care you need, when you need it.  We recommend signing up for the patient portal called "MyChart".  Sign up information is provided on this After Visit Summary.  MyChart is used to connect with patients for Virtual Visits (Telemedicine).  Patients are able to view lab/test results, encounter notes, upcoming appointments, etc.  Non-urgent messages can be sent to your provider as well.   To learn more about what you can do with MyChart, go to [email or web address].    Your next appointment:   1 year(s)  The format for your next appointment:   In Person  Provider:   Jenkins Rouge, MD  or Christen Bame, NP         Other Instructions   Important Information About Sugar

## 2022-05-12 LAB — BASIC METABOLIC PANEL
BUN/Creatinine Ratio: 18 (ref 10–24)
BUN: 16 mg/dL (ref 8–27)
CO2: 24 mmol/L (ref 20–29)
Calcium: 9.4 mg/dL (ref 8.6–10.2)
Chloride: 105 mmol/L (ref 96–106)
Creatinine, Ser: 0.87 mg/dL (ref 0.76–1.27)
Glucose: 90 mg/dL (ref 70–99)
Potassium: 4.8 mmol/L (ref 3.5–5.2)
Sodium: 141 mmol/L (ref 134–144)
eGFR: 88 mL/min/{1.73_m2} (ref >59)

## 2022-06-05 ENCOUNTER — Encounter: Payer: Self-pay | Admitting: *Deleted

## 2022-06-30 ENCOUNTER — Other Ambulatory Visit: Payer: Self-pay | Admitting: Cardiovascular Disease

## 2022-08-03 ENCOUNTER — Other Ambulatory Visit: Payer: Self-pay | Admitting: Cardiovascular Disease

## 2022-08-05 ENCOUNTER — Telehealth: Payer: Self-pay | Admitting: Cardiovascular Disease

## 2022-08-05 NOTE — Telephone Encounter (Signed)
Left message to call back  

## 2022-08-05 NOTE — Telephone Encounter (Signed)
*  STAT* If patient is at the pharmacy, call can be transferred to refill team.   1. Which medications need to be refilled? (please list name of each medication and dose if known) losartan (COZAAR) 100 MG tablet    2. Which pharmacy/location (including street and city if local pharmacy) is medication to be sent to? [id number] - [place], [place] - [address] AT [address]   3. Do they need a 30 day or 90 day supply? 90 day   Patient states he was changed to valsartan (DIOVAN) 320 MG tablet, but he can't take the valsartan he couldn't move when he was on it.

## 2022-08-06 MED ORDER — LOSARTAN POTASSIUM 50 MG PO TABS: 50.0000 mg | ORAL_TABLET | Freq: Every day | ORAL | 3 refills | Status: DC

## 2022-08-06 NOTE — Telephone Encounter (Signed)
Patient called again to follow-up on getting medication refilled.

## 2022-08-06 NOTE — Telephone Encounter (Signed)
Spoke with pt in regards to medication request. Reports stopped taking Valsartan 320 mg  a while ago.  Reports aches that woke him from sleep.  Reports since stopping medication aches have improved.  Took a 1/4 of Valsartan today d/t running out of Losartan.  Would like to be placed back on Losartan.  Does not check BP regularly when does it runs 140's/70's.    Advised pt will send to MD to address.

## 2022-08-06 NOTE — Telephone Encounter (Signed)
Called pt advised of MD response:  Can start with losartan 50 mg daily   Advised pt to check BP daily for the first 2 weeks keep a record and call in or send readings through my chart for review.  Pt is agreeable and thanked me for the call.

## 2022-08-06 NOTE — Telephone Encounter (Signed)
Pt is requesting a change in medication. Please address

## 2022-12-30 IMAGING — CT CT CTA ABD/PEL W/CM AND/OR W/O CM
3 of 12 series · 10 of 46 positions shown, 15 images · IV contrast (APPLIED)
Comparison: 08/16/2017

CLINICAL DATA: 74-year-old male with history of abdominal aortic
aneurysm and right iliac aneurysm status post endovascular repair.
Follow-up study.

EXAM:
CT ANGIOGRAPHY ABDOMEN AND PELVIS WITH CONTRAST AND WITHOUT CONTRAST
TECHNIQUE: Multidetector CT imaging of the abdomen and pelvis was performed
using the standard protocol during bolus administration of
intravenous contrast. Multiplanar reconstructed images and MIPs were
obtained and reviewed to evaluate the vascular anatomy.
CONTRAST:  80mL OMNIPAQUE IOHEXOL 350 MG/ML SOLN

[Series 7: axial arterial · axial · arterial · 0.90mm/px · z∈[+1141,+1627]mm · 3 of 163 slices shown, 7 images]
[im 1/163  soft-tissue]
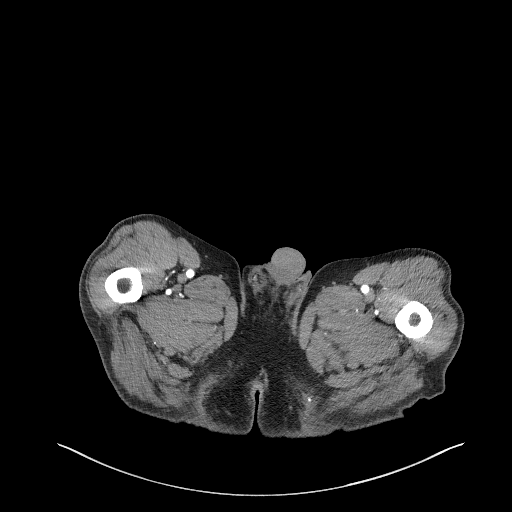
[im 1/163  lung]
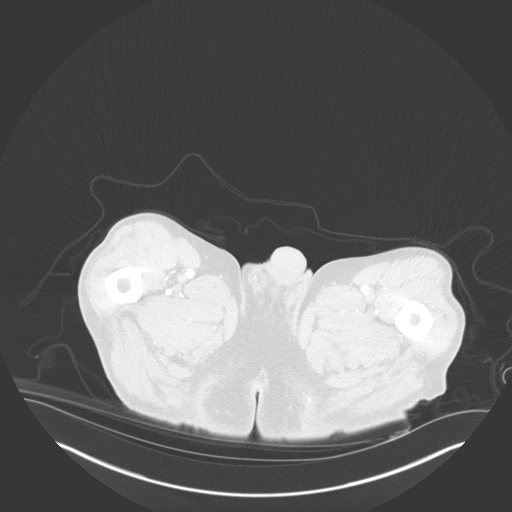
[im 1/163  bone]
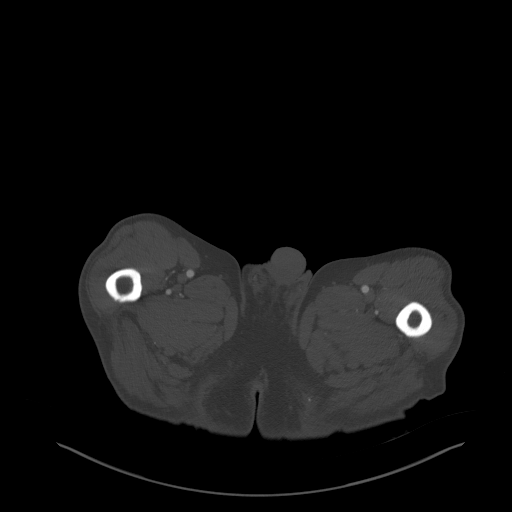
[im 82/163  soft-tissue]
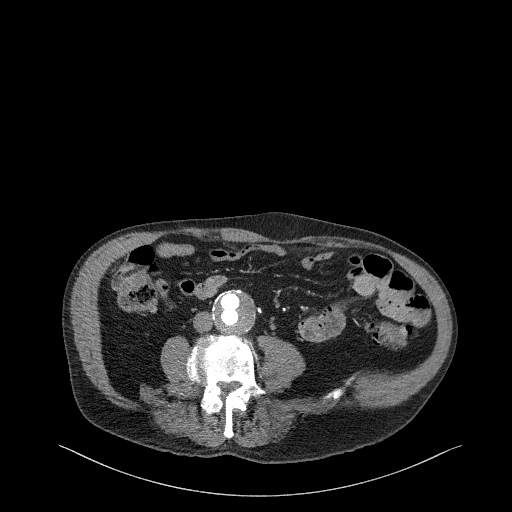
[im 82/163  lung]
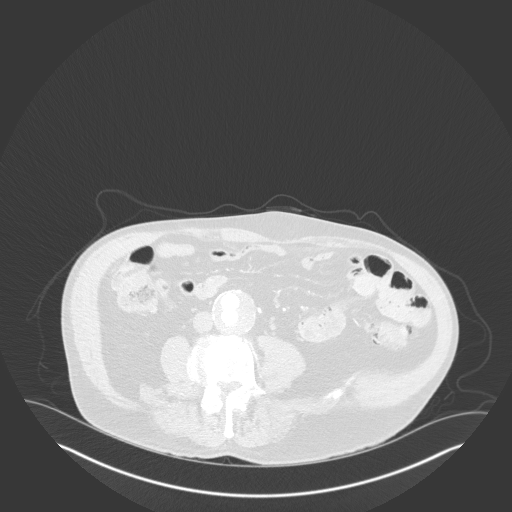
[im 163/163  soft-tissue]
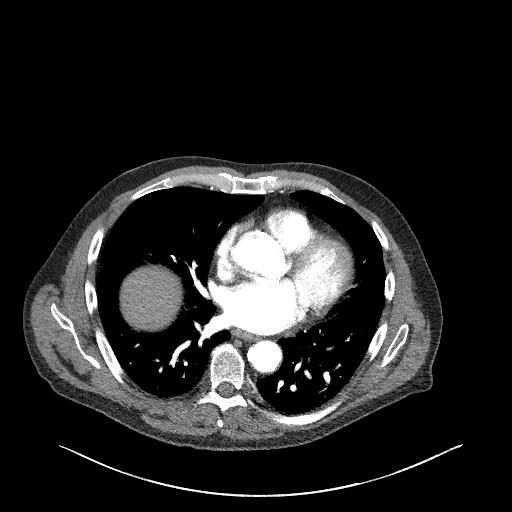
[im 163/163  lung]
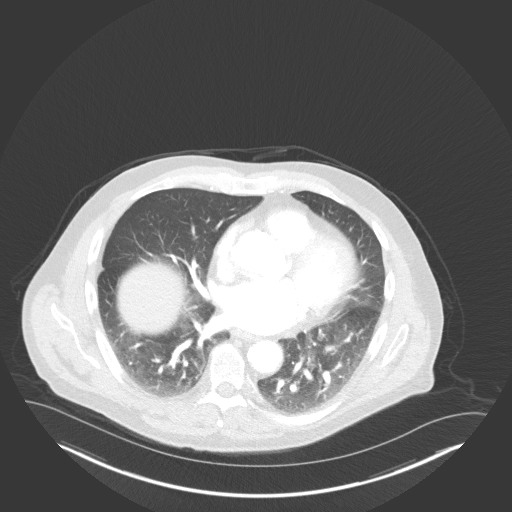

[Series 9: coronals · coronal · 0.68mm/px · 2 of 159 slices shown, 3 images]
[im 53/159  soft-tissue]
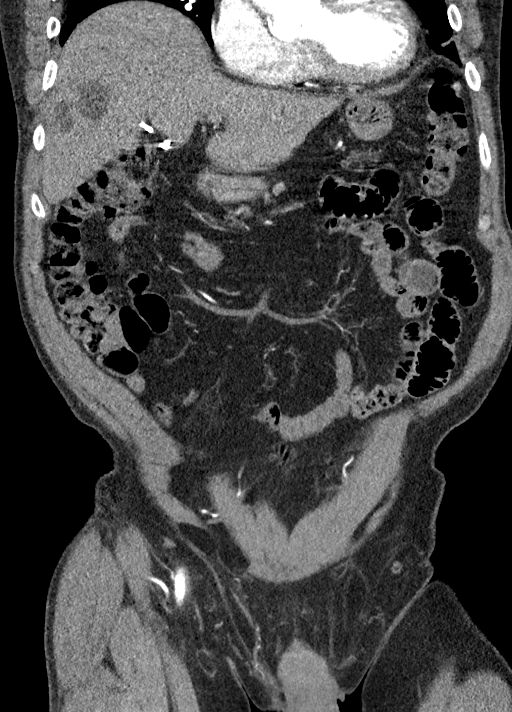
[im 53/159  bone]
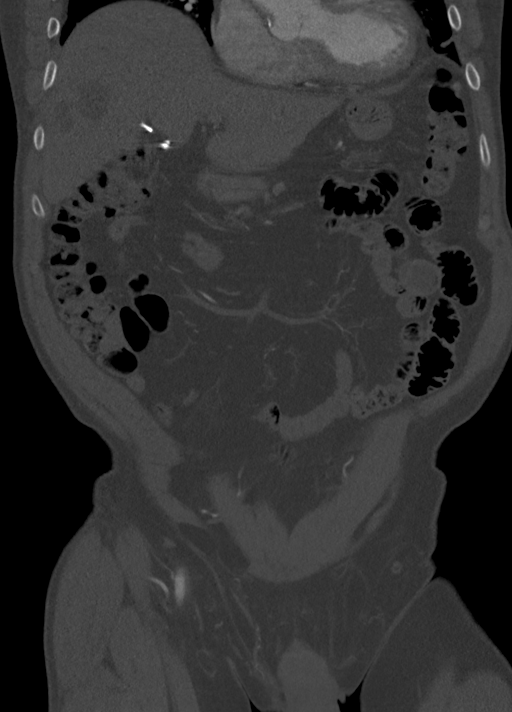
[im 106/159  soft-tissue]
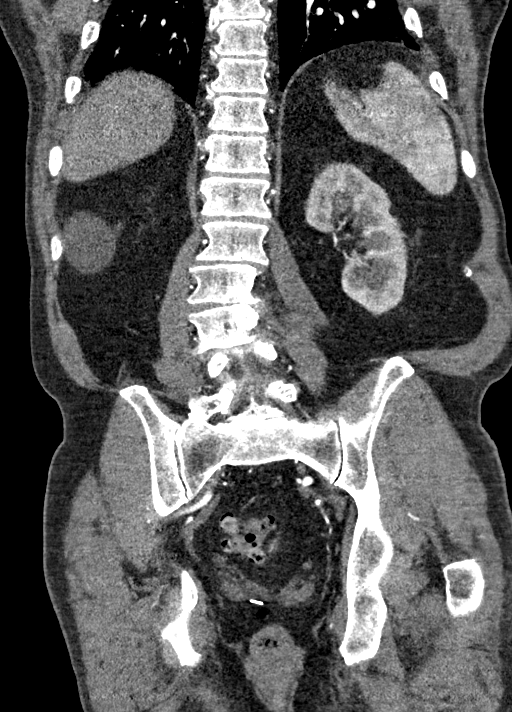

[Series 17: delays 0.6 bf39 2 · axial · 0.89mm/px · z∈[+1284,+1411]mm · 5 of 900 slices shown]
[im 106/900  soft-tissue]
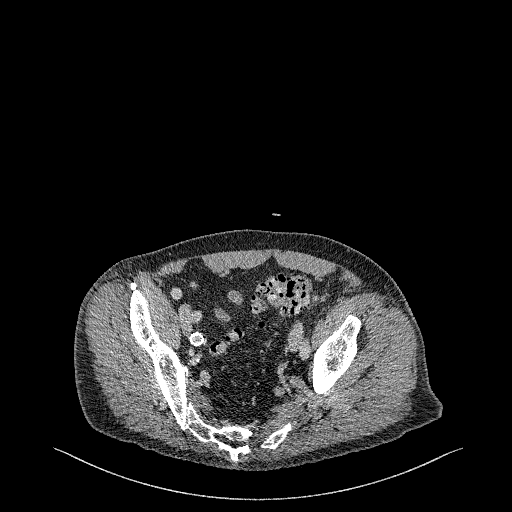
[im 212/900  soft-tissue]
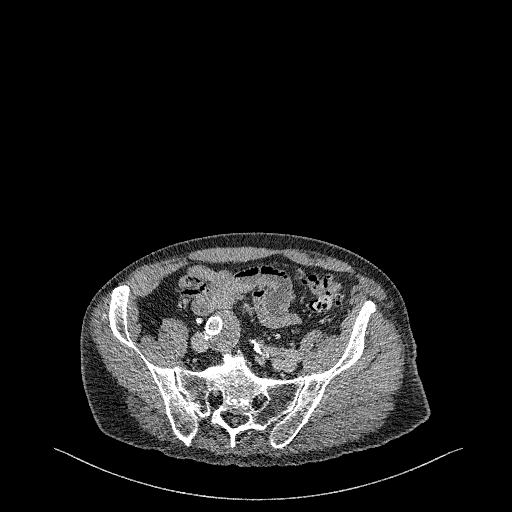
[im 318/900  soft-tissue]
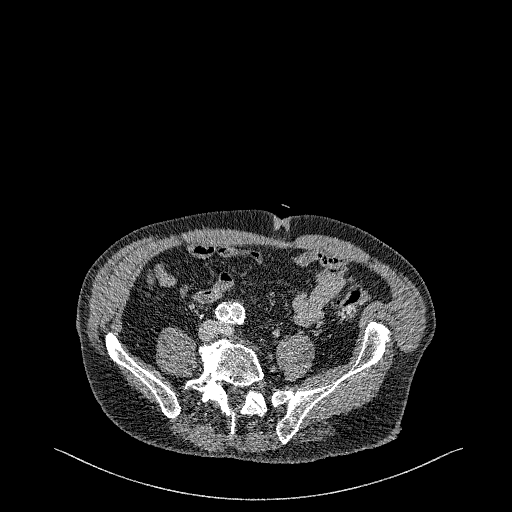
[im 424/900  soft-tissue]
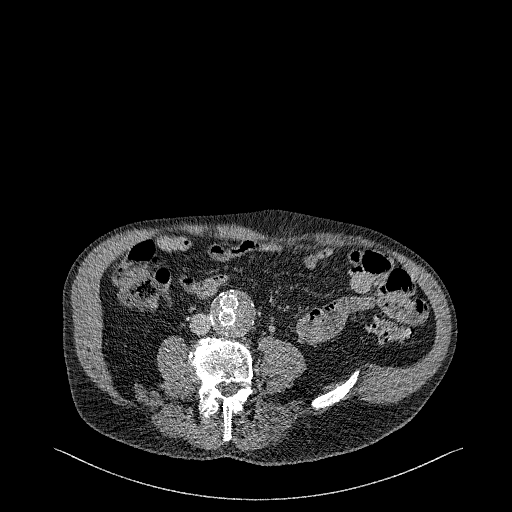
[im 529/900  soft-tissue]
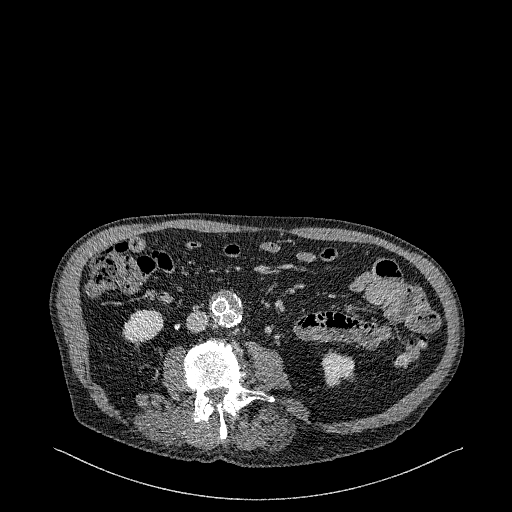

[10 of 46 positions shown; findings below may reference images not displayed]

FINDINGS: VASCULAR

Aorta: Status post aorto bi-iliac endograft repair of abdominal
aortic and right common iliac aneurysms. The aortic aneurysm sac
measures up to 4.1 cm in maximum axial dimension, unchanged by my
measurements. The right common iliac aneurysm sac measures up to
cm in maximum axial dimension, slightly increased from 3.1 cm. No
evidence of endoleak within either aneurysm component. The endograft
is patent throughout. The native aorta is patent and normal in
caliber with scattered fibrofatty and calcific atherosclerotic
changes.

Celiac: Patent without evidence of aneurysm, dissection, vasculitis
or significant stenosis.

SMA: Patent without evidence of aneurysm, dissection, vasculitis or
significant stenosis.

Renals: Single bilateral renal arteries are patent without evidence
of significant stenosis. Again seen is a mildly corrugated
appearance of the proximal right renal artery.

IMA: Occluded proximally with distal reconstitution.

Inflow: Slight interval enlargement of previously visualized
fusiform distal left internal iliac artery aneurysm measuring up to
1.8 x 1.5 cm in maximum axial dimensions, previously 1.5 x 1.2 cm by
my measurements. Status post coil embolization of right internal
iliac artery. Patent right common to external iliac artery endograft
limb. The distal bilateral external iliac arteries are patent.

Proximal Outflow: Bilateral common femoral and visualized portions
of the superficial and profunda femoral arteries are patent without
evidence of aneurysm, dissection, vasculitis or significant
stenosis.

Veins: No obvious venous abnormality.

Review of the MIP images confirms the above findings.

NON-VASCULAR

Lower chest: The heart is normal in size. No pericardial effusion.
Aortic valvular and coronary arterial calcifications are noted.

Hepatobiliary: The liver is normal in size, contour, and
attenuation. Scattered punctate calcified granulomas are again seen.
There are multiple simple hepatic cysts, unchanged from comparison.
Status post cholecystectomy. No intra or extrahepatic biliary ductal
dilation.

Pancreas: Unremarkable. No pancreatic ductal dilatation or
surrounding inflammatory changes.

Spleen: Normal size. No focal masses. Again seen are a few calcified
granulomas.

Adrenals/Urinary Tract: Adrenal glands are unremarkable. Similar
appearing right interpolar exophytic simple renal cyst measuring up
to 6.9 cm in greatest axial dimension. Kidneys are otherwise normal,
without renal calculi, focal lesion, or hydronephrosis. Bladder is
unremarkable.

Stomach/Bowel: Stomach is within normal limits. Appendix appears
normal. Pan colonic diverticula without surrounding inflammatory
changes. No evidence of bowel wall thickening, distention, or
inflammatory changes.

Lymphatic: No abdominopelvic lymphadenopathy.

Reproductive: Prostate gland is enlarged with bulging of the median
lobe into the base of the bladder, unchanged.

Other: No abdominal wall hernia or abnormality. No abdominopelvic
ascites.

Musculoskeletal: Moderate multilevel degenerative changes of the
visualized thoracolumbar spine, most pronounced at L2-L3 and L5-S1.
No acute osseous abnormality or aggressive appearing osseous lesion.
IMPRESSION: VASCULAR

1. Status post aortobi-iliac endograft repair of infrarenal
abdominal aortic and right common iliac artery aneurysms without
complicating features. No evidence of endoleak.
2. Slight interval enlargement of fusiform left internal iliac
artery aneurysm to 1.8 cm from 1.5 cm in 4534.
3. Similar appearing changes compatible with uncomplicated,
multifocal fibromuscular dysplasia of the right renal artery.
4.  Aortic Atherosclerosis ([id number]).

NON-VASCULAR

1. No acute abnormality in the abdomen or pelvis.
2. Similar appearing changes of prior granulomatous disease.
3. Colonic diverticulosis.
4. Prostatomegaly.

## 2023-04-16 ENCOUNTER — Other Ambulatory Visit: Payer: Self-pay

## 2023-04-16 DIAGNOSIS — I714 Abdominal aortic aneurysm, without rupture, unspecified: Secondary | ICD-10-CM

## 2023-05-03 ENCOUNTER — Ambulatory Visit (HOSPITAL_COMMUNITY)
Admission: RE | Admit: 2023-05-03 | Discharge: 2023-05-03 | Disposition: A | Discharge status: Home / Self Care | Payer: Medicare PPO | Source: Ambulatory Visit | Attending: Surgery | Admitting: Surgery

## 2023-05-03 ENCOUNTER — Ambulatory Visit: Payer: Medicare PPO | Admitting: Physician Assistant

## 2023-05-03 VITALS — BP 163/82 | HR 52 | Temp 98.0°F | Ht 67.0 in | Wt 165.5 lb

## 2023-05-03 DIAGNOSIS — I714 Abdominal aortic aneurysm, without rupture, unspecified: Secondary | ICD-10-CM | POA: Diagnosis not present

## 2023-05-03 NOTE — Progress Notes (Signed)
[facility] [place] HISTORY AND PHYSICAL   History of Present Illness:  Patient is a 77 y.o. year old male who presents for evaluation of abdominal aortic aneurysm with history of  Endovascular repair of infrarenal and right common iliac artery aneurysm and Embolization of right hypogastric artery.    He denies abdominal pain or lumbar pain.  He stays very active and eats healthy.  His wife is here with him today.  He denies claudication symptoms on the right buttock, no LE claudication , rest pain or wounds.  He is medically managed on ASA and Crestor daily.        Past Medical History:  Diagnosis Date   AAA (abdominal aortic aneurysm) (HCC)    Allergy    Arthritis    Asthma    as a child   CAD (coronary artery disease)    s/p angioplasty   Cataract    removed both eyes   Chronic abdominal pain    RUQ   Colon polyps    Diverticulosis    GERD (gastroesophageal reflux disease)    pt states im[proved    History of atrial fibrillation    when recieved a stent    HLD (hyperlipidemia)    HTN (hypertension)    IBS (irritable bowel syndrome)    Skin cancer     Past Surgical History:  Procedure Laterality Date   ABDOMINAL AORTIC ENDOVASCULAR STENT GRAFT N/A 07/14/2017   Procedure: ABDOMINAL AORTIC ENDOVASCULAR STENT GRAFT, INSERTION WITH RIGHT ILIAC BRANCH DEVICE;  Surgeon: Nada Libman, MD;  Location: [facility] OR;  Service: Vascular;  Laterality: N/A;   CARDIAC CATHETERIZATION  1997   CHOLECYSTECTOMY     COLONOSCOPY     COLONOSCOPY     CORONARY ANGIOPLASTY WITH STENT PLACEMENT  1997   EXPLORATORY LAPAROTOMY     EYE SURGERY     cataracts   KNEE SURGERY     POLYPECTOMY     TENDON REPAIR       Social History Social History   Tobacco Use   Smoking status: Former    Current packs/day: 0.00    Types: Cigarettes    Quit date: 06/08/2000    Years since quitting: 22.9   Smokeless tobacco: Former  Building services engineer status: Never Used  Substance Use  Topics   Alcohol use: Yes    Alcohol/week: 0.0 standard drinks of alcohol    Comment: occ beer    Drug use: No    Family History Family History  Problem Relation Age of Onset   Heart disease Mother    Breast cancer Mother    AAA (abdominal aortic aneurysm) Mother    Kidney cancer Father        reoccurance 30 yrs later and mets all over body    Heart disease Brother        before age 61   Hyperlipidemia Brother    Hypertension Brother    Heart disease Son    Liver disease Brother    Kidney disease Brother    Colon cancer Neg Hx    Colon polyps Neg Hx    Esophageal cancer Neg Hx    Rectal cancer Neg Hx    Stomach cancer Neg Hx     Allergies  Allergies  Allergen Reactions   Amlodipine Swelling   Lipitor [Atorvastatin]     gen muscle pain   Lisinopril Swelling   Metoprolol Other (See Comments)    Avoid BB due  to RBBB and bradycardia   Lactose Intolerance (Gi)     Bloating and gas     Current Outpatient Medications  Medication Sig Dispense Refill   aspirin 81 MG tablet Take 81 mg by mouth daily.       ezetimibe (ZETIA) 10 MG tablet Take 10 mg by mouth daily.     losartan (COZAAR) 50 MG tablet Take 1 tablet (50 mg total) by mouth daily. 90 tablet 3   nitroGLYCERIN (NITROSTAT) 0.4 MG SL tablet Place 1 tablet (0.4 mg total) under the tongue every 5 (five) minutes as needed. 25 tablet 3   rosuvastatin (CRESTOR) 20 MG tablet TAKE 1 TABLET(20 MG) BY MOUTH DAILY 90 tablet 3   No current facility-administered medications for this visit.    ROS:   General:  No weight loss, Fever, chills  HEENT: No recent headaches, no nasal bleeding, no visual changes, no sore throat  Neurologic: No dizziness, blackouts, seizures. No recent symptoms of stroke or mini- stroke. No recent episodes of slurred speech, or temporary blindness.  Cardiac: No recent episodes of chest pain/pressure, no shortness of breath at rest.  No shortness of breath with exertion.  Denies history of atrial  fibrillation or irregular heartbeat  Vascular: No history of rest pain in feet.  No history of claudication.  No history of non-healing ulcer, No history of DVT   Pulmonary: No home oxygen, no productive cough, no hemoptysis,  No asthma or wheezing  Musculoskeletal:  [ ]  Arthritis, [ ]  Low back pain,  [ ]  Joint pain  Hematologic:No history of hypercoagulable state.  No history of easy bleeding.  No history of anemia  Gastrointestinal: No hematochezia or melena,  No gastroesophageal reflux, no trouble swallowing  Urinary: [ ]  chronic Kidney disease, [ ]  on HD - [ ]  MWF or [ ]  TTHS, [ ]  Burning with urination, [ ]  Frequent urination, [ ]  Difficulty urinating;   Skin: No rashes  Psychological: No history of anxiety,  No history of depression   Physical Examination  Vitals:   05/03/23 0954  BP: (!) 163/82  Pulse: (!) 52  Temp: 98 F (36.7 C)  TempSrc: Temporal  SpO2: 98%  Weight: 165 lb 8 oz (75.1 kg)  Height: 5\' 7"  (1.702 m)    Body mass index is 25.92 kg/m.  General:  Alert and oriented, no acute distress HEENT: Normal Neck: No bruit or JVD Pulmonary: Clear to auscultation bilaterally Cardiac: Regular Rate and Rhythm without murmur Gastrointestinal: Soft, non-tender, non-distended, no mass, no scars Skin: No rash Extremity Pulses:  2+ radial, femoral, dorsalis pedis, pulses bilaterally Musculoskeletal: No deformity or edema  Neurologic: Upper and lower extremity motor 5/5 and symmetric  DATA:  Abdominal Aorta Findings:  +-------------+-------+----------+----------+--------+--------+--------+  Location    AP (cm)Trans (cm)PSV (cm/s)WaveformThrombusComments  +-------------+-------+----------+----------+--------+--------+--------+  RT EIA Distal                 93        biphasic                  +-------------+-------+----------+----------+--------+--------+--------+  LT EIA Distal                 93        biphasic                   +-------------+-------+----------+----------+--------+--------+--------+     Endovascular Aortic Repair (EVAR):  +----------+----------------+-------------------+-------------------+           Diameter AP (cm)Diameter Trans (  cm)Velocities (cm/sec)  +----------+----------------+-------------------+-------------------+  Aorta    3.90            3.84               67                   +----------+----------------+-------------------+-------------------+  Right Limb2.38            2.73               71                   +----------+----------------+-------------------+-------------------+  Left Limb 1.35            1.29               105                  +----------+----------------+-------------------+-------------------+        Summary:  Abdominal Aorta: Patent endovascular aneurysm repair with no evidence of  endoleak. Previous diameter measurement was obtained on 04/27/22: 3.88 x  3.86 cm. The aorta was 3.25 x 3.16 cm.      ASSESSMENT/PLAN:  AAA and right Common iliac aneurysm s/p Endovascular repair of infrarenal and right common iliac artery aneurysm and Embolization of right hypogastric artery.  The left Common iliac is stable 1.35 cm largest diameter.  The right common iliac is 2.73 today and the report states there is no endo leak.  Plan will be repeat duplex in 6 months.  He will plan for CTA in 2 years per Dr. Estanislado Spire last note on 04/07/21.    Pre EVAR:  His maximum aortic diameter was 4.0 cm on duplex today he has a largest diameter of 3.9. The right common Iliac  aneurysm measured 3.3 cm.  He denies buttock or calve claudication with walking.        Mosetta Pigeon PA-C [facility] [place] Office: [phone] MD on call Hetty Blend

## 2023-05-04 ENCOUNTER — Other Ambulatory Visit: Payer: Self-pay

## 2023-05-04 DIAGNOSIS — I714 Abdominal aortic aneurysm, without rupture, unspecified: Secondary | ICD-10-CM

## 2023-05-15 NOTE — Progress Notes (Unsigned)
Cardiology Office Note:  .   Date:  05/17/2023  ID:  Kevin Blake, DOB [birth year], MRN [id number] PCP: Olive Bass, MD   [facility] Providers Cardiologist:  Charlton Haws, MD    Patient Profile: .      PMH Coronary artery disease Remote stent to RCA 1997 ETT 02/2019>>low risk Peripheral vascular disease Asymptomatic AAA Right common iliac aneurysm  S/p endovascular repair of infrarenal and right common iliac artery aneurysm and embolization of right hypogastric artery 07/2017 Hyperlipidemia Hypertension Right bundle branch block  History of distal stent to RCA 1997.  He had normal stress test in 2009 and 2020.  He has been followed by Dr. Myra Gianotti, [facility], for vascular disease.  History of asymptomatic AAA and right common iliac aneurysm.  S/p endovascular repair of infrarenal and right common iliac artery aneurysm on 07/14/2017 and embolization of right hypogastric artery.  Last cardiology clinic visit was 05/04/2022 with me. He was not having any concerning cardiac symptoms. BP was elevated.  We discontinued losartan and started valsartan 320 mg daily.Renal function was stable on labs completed 05/12/2022.        History of Present Illness: .   Kevin Blake is a very pleasant 77 y.o. male who is here today for follow-up and accompanied by his wife. He reports no new complaints and continues to engage in regular activities such as gardening, house projects, and walking dogs. He denies chest pain, shortness of breath, lightheadedness, palpitations, orthopnea, PND, presyncope or syncope. Weight has been stable. We discussed history of right bundle branch block and goal for cholesterol.   Discussed the use of AI scribe software for clinical note transcription with the patient, who gave verbal consent to proceed.   ROS: See HPI       Studies Reviewed: Marland Kitchen   EKG Interpretation Date/Time:  Monday May 17 2023 11:29:47 EST Ventricular Rate:  60 PR Interval:  168 QRS  Duration:  122 QT Interval:  450 QTC Calculation: 450 R Axis:   -36  Text Interpretation: Normal sinus rhythm Left axis deviation Right bundle branch block When compared with ECG of 26-Oct-2019 10:13, No significant change was found Confirmed by Eligha Bridegroom [phone]) on 05/17/2023 11:35:38 AM    Risk Assessment/Calculations:             Physical Exam:   VS:  BP 122/68   Pulse 60   Ht 5\' 7"  (1.702 m)   Wt 165 lb (74.8 kg)   SpO2 95%   BMI 25.84 kg/m    Wt Readings from Last 3 Encounters:  05/17/23 165 lb (74.8 kg)  05/03/23 165 lb 8 oz (75.1 kg)  05/04/22 165 lb 3.2 oz (74.9 kg)    GEN: Well nourished, well developed in no acute distress NECK: No JVD; No carotid bruits CARDIAC: RRR, no murmurs, rubs, gallops RESPIRATORY:  Clear to auscultation without rales, wheezing or rhonchi  ABDOMEN: Soft, non-tender, non-distended EXTREMITIES:  No edema; No deformity     ASSESSMENT AND PLAN: .    CAD without angina: Remote stenting of RCA 1997. Low risk ETT 02/2019. He denies chest pain, dyspnea, or other symptoms concerning for angina. No indication for further ischemic evaluation at this time. No bleeding concerns. EKG without concerning ST abnormality. Goal for LDL< 70, plan noted below.  Continue aspirin, ezetimibe, losartan, rosuvastatin.  PVD: AAA and right common iliac aneurysm s/p EVAR infrarenal and rt common iliac artery aneurysm and embolization of right hypogastric artery. Left common iliac 1.35  cm largest diameter, right common iliac 2.73 with no endo leak on imaging 04/2023. Maximum aortic diameter 3.9 cm. He Seen by [facility] 05/03/23, continued to be asymptomatic. No concerns today. Plan for repeat imaging in 6 months.   Right bundle branch block: Known. We discussed this finding in detail and that he has no previous echocardiogram to review. He is asymptomatic. Through shared decision making, he agreed that we will get echocardiogram to evaluate heart and valve function.    Hypertension: BP is well controlled.  He monitors occasionally with no concern for consistently elevated BP.   Hyperlipidemia LDL goal <70: Lipid panel 95/1884 with LDL direct 89, HDL 42, triglycerides 119, total cholesterol 153.  We will update fasting lipid panel.  Lengthy discussion about management of cholesterol for LDL goal less than 70.  He is agreeable to consider addition of bempedoic acid or PCSK9 inhibitor if LDL remains elevated.       Dispo: 1 year with Dr. Eden Emms or me  Signed, Eligha Bridegroom, NP-C

## 2023-05-17 ENCOUNTER — Encounter: Payer: Self-pay | Admitting: Nurse Practitioner

## 2023-05-17 ENCOUNTER — Ambulatory Visit: Payer: Medicare PPO | Attending: Nurse Practitioner | Admitting: Nurse Practitioner

## 2023-05-17 VITALS — BP 122/68 | HR 60 | Ht 67.0 in | Wt 165.0 lb

## 2023-05-17 DIAGNOSIS — I2510 Atherosclerotic heart disease of native coronary artery without angina pectoris: Secondary | ICD-10-CM | POA: Diagnosis not present

## 2023-05-17 DIAGNOSIS — E785 Hyperlipidemia, unspecified: Secondary | ICD-10-CM

## 2023-05-17 DIAGNOSIS — I739 Peripheral vascular disease, unspecified: Secondary | ICD-10-CM

## 2023-05-17 DIAGNOSIS — I4510 Unspecified right bundle-branch block: Secondary | ICD-10-CM

## 2023-05-17 DIAGNOSIS — I10 Essential (primary) hypertension: Secondary | ICD-10-CM

## 2023-05-17 NOTE — Patient Instructions (Signed)
Medication Instructions:   Your physician recommends that you continue on your current medications as directed. Please refer to the Current Medication list given to you today.   *If you need a refill on your cardiac medications before your next appointment, please call your pharmacy*   Lab Work:  IN [place] FASTING AFTER MIDNIGHT ANYTIME THIS WEEK.  Pt given paperwork today.   If you have labs (blood work) drawn today and your tests are completely normal, you will receive your results only by: MyChart Message (if you have MyChart) OR A paper copy in the mail If you have any lab test that is abnormal or we need to change your treatment, we will call you to review the results.   Testing/Procedures:  Your physician has requested that you have an echocardiogram. Echocardiography is a painless test that uses sound waves to create images of your heart. It provides your doctor with information about the size and shape of your heart and how well your heart's chambers and valves are working. This procedure takes approximately one hour. There are no restrictions for this procedure. Please do NOT wear cologne, aftershave, or lotions (deodorant is allowed). Please arrive 15 minutes prior to your appointment time.  Please note: We ask at that you not bring children with you during ultrasound (echo/ vascular) testing. Due to room size and safety concerns, children are not allowed in the ultrasound rooms during exams. Our front office staff cannot provide observation of children in our lobby area while testing is being conducted. An adult accompanying a patient to their appointment will only be allowed in the ultrasound room at the discretion of the ultrasound technician under special circumstances. We apologize for any inconvenience.     Follow-Up: At [facility], you and your health needs are our priority.  As part of our continuing mission to provide you with exceptional heart care, we  have created designated Provider Care Teams.  These Care Teams include your primary Cardiologist (physician) and Advanced Practice Providers (APPs -  Physician Assistants and Nurse Practitioners) who all work together to provide you with the care you need, when you need it.  We recommend signing up for the patient portal called "MyChart".  Sign up information is provided on this After Visit Summary.  MyChart is used to connect with patients for Virtual Visits (Telemedicine).  Patients are able to view lab/test results, encounter notes, upcoming appointments, etc.  Non-urgent messages can be sent to your provider as well.   To learn more about what you can do with MyChart, go to [email or web address].    Your next appointment:   1 year(s)  Provider:   Charlton Haws, MD     Other Instructions  Your physician wants you to follow-up in: 1 year with Dr. Eden Emms.  You will receive a reminder letter in the mail two months in advance. If you don't receive a letter, please call our office to schedule the follow-up appointment.

## 2023-05-18 LAB — CBC

## 2023-05-19 ENCOUNTER — Other Ambulatory Visit (HOSPITAL_COMMUNITY): Payer: Self-pay

## 2023-05-19 ENCOUNTER — Telehealth: Payer: Self-pay | Admitting: Pharmacy Technician

## 2023-05-19 ENCOUNTER — Telehealth: Payer: Self-pay | Admitting: Nurse Practitioner

## 2023-05-19 LAB — COMPREHENSIVE METABOLIC PANEL
ALT: 25 [IU]/L (ref 0–44)
AST: 25 [IU]/L (ref 0–40)
Albumin: 4.6 g/dL (ref 3.8–4.8)
Alkaline Phosphatase: 62 [IU]/L (ref 44–121)
BUN/Creatinine Ratio: 22 (ref 10–24)
BUN: 18 mg/dL (ref 8–27)
Bilirubin Total: 0.6 mg/dL (ref 0.0–1.2)
CO2: 24 mmol/L (ref 20–29)
Calcium: 9.3 mg/dL (ref 8.6–10.2)
Chloride: 107 mmol/L — ABNORMAL HIGH (ref 96–106)
Creatinine, Ser: 0.82 mg/dL (ref 0.76–1.27)
Globulin, Total: 2.1 g/dL (ref 1.5–4.5)
Glucose: 93 mg/dL (ref 70–99)
Potassium: 4.4 mmol/L (ref 3.5–5.2)
Sodium: 142 mmol/L (ref 134–144)
Total Protein: 6.7 g/dL (ref 6.0–8.5)
eGFR: 89 mL/min/{1.73_m2} (ref >59)

## 2023-05-19 LAB — CBC
Hematocrit: 45.4 % (ref 37.5–51.0)
Hemoglobin: 15.2 g/dL (ref 13.0–17.7)
MCH: 31.4 pg (ref 26.6–33.0)
MCHC: 33.5 g/dL (ref 31.5–35.7)
MCV: 94 fL (ref 79–97)
Platelets: 190 10*3/uL (ref 150–450)
RBC: 4.84 x10E6/uL (ref 4.14–5.80)
RDW: 12.5 % (ref 11.6–15.4)
WBC: 10.1 10*3/uL (ref 3.4–10.8)

## 2023-05-19 LAB — LIPID PANEL
Chol/HDL Ratio: 3.4 {ratio} (ref 0.0–5.0)
Cholesterol, Total: 151 mg/dL (ref 100–199)
HDL: 45 mg/dL (ref >39)
LDL Chol Calc (NIH): 87 mg/dL (ref 0–99)
Triglycerides: 103 mg/dL (ref 0–149)
VLDL Cholesterol Cal: 19 mg/dL (ref 5–40)

## 2023-05-19 NOTE — Telephone Encounter (Signed)
Pt has hx of CAD with previous stenting and LDL is 87 on ezetimibe and rosuvastatin. I would like to see which of these agents if most affordable for him.   PCSK9 inhibitor or Nexlizet. Can you please do a test claim for these agents? Please let me know if there is anything more you need from me.  Thank you, Marcelino Duster

## 2023-05-19 NOTE — Telephone Encounter (Signed)
Pharmacy Patient Advocate Encounter  Received notification from [facility] that Prior Authorization for repatha has been APPROVED from 06/08/22 to 06/07/24. Ran test claim, Copay is $40.00 one month. This test claim was processed through [facility]- copay amounts may vary at other pharmacies due to pharmacy/plan contracts, or as the patient moves through the different stages of their insurance plan.   PA #/Case ID/Reference #: [id number]

## 2023-05-19 NOTE — Telephone Encounter (Signed)
Pharmacy Patient Advocate Encounter   Received notification from Pt Calls Messages that prior authorization for repatha is required/requested.   Insurance verification completed.   The patient is insured through [employer] .   Per test claim: PA required; PA submitted to above mentioned insurance via CoverMyMeds Key/confirmation #/EOC [facility] Status is pending

## 2023-05-20 ENCOUNTER — Telehealth: Payer: Self-pay | Admitting: Pharmacy Technician

## 2023-05-20 ENCOUNTER — Other Ambulatory Visit (HOSPITAL_COMMUNITY): Payer: Self-pay

## 2023-05-20 NOTE — Telephone Encounter (Signed)
Pharmacy Patient Advocate Encounter   Received notification from Pt Calls Messages that prior authorization for nexlizet is required/requested.   Insurance verification completed.   The patient is insured through [employer] .   Per test claim: PA required; PA submitted to above mentioned insurance via CoverMyMeds Key/confirmation #/[id number] Status is pending

## 2023-05-20 NOTE — Telephone Encounter (Signed)
Pharmacy Patient Advocate Encounter  Received notification from [facility] that Prior Authorization for nexlizet has been APPROVED from 06/08/22 to 06/07/24. Ran test claim, Copay is $40.00 one month. This test claim was processed through [facility]- copay amounts may vary at other pharmacies due to pharmacy/plan contracts, or as the patient moves through the different stages of their insurance plan.   PA #/Case ID/Reference #: [id number]

## 2023-05-20 NOTE — Telephone Encounter (Signed)
Please notify patient that both Repatha and Nexlizet will be $40 per month per test claim through [facility]. Cost may vary slightly with his pharmacy.  Would recommend he start either the oral medication, Nexlizet or the self-injectable Repatha in addition to ezetimibe and rosuvastatin that he is already taking for goal LDL < 70. We will recheck lipid panel and ALT in 2-3 months once he starts one of these medications.

## 2023-05-26 NOTE — Telephone Encounter (Signed)
S/w pt to update on the cost of New medications. Pt stated would have to think about it and get back to office. Will send to Lebron Conners to [place].

## 2023-06-14 ENCOUNTER — Telehealth: Payer: Self-pay | Admitting: Nurse Practitioner

## 2023-06-14 NOTE — Telephone Encounter (Signed)
 Pt c/o medication issue:  1. Name of Medication: Nexlizet  2. How are you currently taking this medication (dosage and times per day)? N/A  3. Are you having a reaction (difficulty breathing--STAT)? no  4. What is your medication issue? Patient is concerned about medication's side effects, he wants something else to be prescribed. P

## 2023-06-15 ENCOUNTER — Ambulatory Visit: Payer: Medicare PPO | Attending: Cardiology

## 2023-06-15 DIAGNOSIS — E785 Hyperlipidemia, unspecified: Secondary | ICD-10-CM

## 2023-06-15 DIAGNOSIS — I10 Essential (primary) hypertension: Secondary | ICD-10-CM

## 2023-06-15 DIAGNOSIS — I2510 Atherosclerotic heart disease of native coronary artery without angina pectoris: Secondary | ICD-10-CM

## 2023-06-15 DIAGNOSIS — I4510 Unspecified right bundle-branch block: Secondary | ICD-10-CM

## 2023-06-15 LAB — ECHOCARDIOGRAM COMPLETE
Area-P 1/2: 2.76 cm2
MV M vel: 4.39 m/s
MV Peak grad: 77.1 mm[Hg]
S' Lateral: 3 cm

## 2023-06-15 NOTE — Telephone Encounter (Signed)
 Noted, thank you

## 2023-06-15 NOTE — Telephone Encounter (Signed)
 S/w pt does not want to take Nexlizet due to pt  read side effects can cause tendon damage.  Pt states has tendon graft. Explained never heard of anyone with that issue. Pt did not want to try injection for Praulent or Repatha . Did state the specific reasons to start theses medications. Pt stated would research and would call back.  Will send to [facility] to [place].

## 2023-06-15 NOTE — Telephone Encounter (Signed)
 Please follow-up with patient to ask about specific concerns with either Praluent/Repatha  or with Nexlizet. We discussed at his last office visit that goal for LDL cholesterol is 70 or lower and his is 85 on ezetimibe  and rosuvastatin . I am recommending more potent medication.

## 2023-06-22 ENCOUNTER — Telehealth: Payer: Self-pay | Admitting: Nurse Practitioner

## 2023-06-22 NOTE — Telephone Encounter (Signed)
 Pt aware of echo results, pt verbalized understanding of findings.

## 2023-06-22 NOTE — Telephone Encounter (Signed)
 Patient is returning call to discuss echo results.

## 2023-06-23 ENCOUNTER — Telehealth: Payer: Self-pay | Admitting: Nurse Practitioner

## 2023-06-23 ENCOUNTER — Other Ambulatory Visit (HOSPITAL_BASED_OUTPATIENT_CLINIC_OR_DEPARTMENT_OTHER): Payer: Self-pay | Admitting: *Deleted

## 2023-06-23 DIAGNOSIS — E785 Hyperlipidemia, unspecified: Secondary | ICD-10-CM

## 2023-06-23 DIAGNOSIS — E7849 Other hyperlipidemia: Secondary | ICD-10-CM

## 2023-06-23 DIAGNOSIS — I2510 Atherosclerotic heart disease of native coronary artery without angina pectoris: Secondary | ICD-10-CM

## 2023-06-23 MED ORDER — REPATHA SURECLICK 140 MG/ML ~~LOC~~ SOAJ: 140.0000 mg | SUBCUTANEOUS | 3 refills | Status: AC

## 2023-06-23 MED ORDER — ROSUVASTATIN CALCIUM 20 MG PO TABS: 20.0000 mg | ORAL_TABLET | Freq: Every day | ORAL | 11 refills | Status: DC

## 2023-06-23 MED ORDER — EZETIMIBE 10 MG PO TABS: 10.0000 mg | ORAL_TABLET | Freq: Every day | ORAL | 11 refills | Status: DC

## 2023-06-23 NOTE — Telephone Encounter (Signed)
 Would recommend that he continue rosuvastatin  and ezetimibe  in addition to Repatha  until we get follow-up lab work after 4 injections.

## 2023-06-23 NOTE — Telephone Encounter (Signed)
 S/w pt is aware to stay on all cholesterol meds even with the new start of repatha . Medications sent in today to requested pharmacy. Placed orders for  fasting NMR/ALT, released and mailed. Confirmed mailing address.

## 2023-06-23 NOTE — Telephone Encounter (Signed)
 S/w pt is taking rosuvastatin  ( 20 mg ) daily along with zetia  one (1) tablet by mouth ( 10 mg) daily. Pt wants to start Repatha  and wants to d/c other cholesterol meds. Will advise with Moira Andrews and call pt back.

## 2023-06-23 NOTE — Telephone Encounter (Signed)
 Pt c/o medication issue:  1. Name of Medication:  ezetimibe  (ZETIA ) 10 MG tablet  2. How are you currently taking this medication (dosage and times per day)?   3. Are you having a reaction (difficulty breathing--STAT)?   4. What is your medication issue?   Patient would like to know if he needs to continue on Ezetimibe  with starting on Nexlizet and Repatha . Please advise.

## 2023-08-06 ENCOUNTER — Other Ambulatory Visit: Payer: Self-pay | Admitting: Cardiovascular Disease

## 2023-08-10 LAB — NMR, LIPOPROFILE
Cholesterol, Total: 94 mg/dL — ABNORMAL LOW (ref 100–199)
HDL Particle Number: 35.2 umol/L (ref >=30.5)
HDL-C: 52 mg/dL (ref >39)
LDL Particle Number: <300 nmol/L (ref <1000)
LDL Size: 20 nm — ABNORMAL LOW (ref >20.5)
LDL-C (NIH Calc): 24 mg/dL (ref 0–99)
LP-IR Score: 37 (ref <=45)
Small LDL Particle Number: 142 nmol/L (ref <=527)
Triglycerides: 94 mg/dL (ref 0–149)

## 2023-08-10 LAB — ALT: ALT: 27 IU/L (ref 0–44)

## 2023-08-17 ENCOUNTER — Telehealth: Payer: Self-pay | Admitting: Cardiovascular Disease

## 2023-08-17 DIAGNOSIS — E7849 Other hyperlipidemia: Secondary | ICD-10-CM

## 2023-08-17 NOTE — Telephone Encounter (Signed)
 Patient would like to discuss his cholesterol medications in more detail with a provider or Pharm D. Will forward message to Eligha Bridegroom NP and our pharmacy team.

## 2023-08-17 NOTE — Telephone Encounter (Signed)
 Pt c/o medication issue:  1. Name of Medication:   Evolocumab (REPATHA SURECLICK) 140 MG/ML SOAJ   2. How are you currently taking this medication (dosage and times per day)?   As prescribed  3. Are you having a reaction (difficulty breathing--STAT)?   No  4. What is your medication issue?   Patient stated when he picked up this medication he received 3 boxes with 2 doses and he wants to know what he should do with the additional medication.  Patient wants a call back to discuss if he should continue on this medication

## 2023-08-18 NOTE — Telephone Encounter (Signed)
 Lvm to have pt call office back.  Very glad pt received a 3 month supply since this is a long term medication, pt just needs to keep this in the refrigerator.

## 2023-08-18 NOTE — Telephone Encounter (Signed)
 Will you please see what his questions are? Is he asking where/how to store his Repatha? Please ensure he is refrigerating it. If there are questions I need to address I can try to call him later.

## 2023-08-19 MED ORDER — LOSARTAN POTASSIUM 50 MG PO TABS: 50.0000 mg | ORAL_TABLET | Freq: Every day | ORAL | Status: DC

## 2023-08-19 MED ORDER — ROSUVASTATIN CALCIUM 20 MG PO TABS: 10.0000 mg | ORAL_TABLET | Freq: Every day | ORAL | Status: AC

## 2023-08-19 NOTE — Telephone Encounter (Signed)
 S/w pt does not want # 90 supply on medications.  Pt stated cut rosuvastatin in half, one half (0.5) tablet by mouth ( 10 mg) daily.  Updated medication list.

## 2023-11-03 NOTE — Progress Notes (Signed)
 Kevin Blake 1945-12-09 [id number] Lamar LITTIE Hobby, MD 11/03/2023    FAMILY MEDICINE OFFICE VISIT    ASSESSMENT/PLAN   1. Adenopathy (Primary) 11/03/2023  OV:  This is an undiagnosed new problem with uncertain prognosis - routine education - initial testing, if all OK consider lymph node removal or needle biopsy - - Related orders:  - Comprehensive Metabolic Panel; Future - CBC with Differential; Future - Sedimentation Rate (ESR); Future - Lyme Disease Total Antibody with Reflex to Immunoassay; Future - Rapid Plasma Reagin (RPR), Qualitative Test with Reflex to Titer and Confirmation; Future - Cytomegalovirus (CMV) Total Antibodies, IgG And IgM; Future  2. Screening for diabetes mellitus 11/03/2023 - normal non-fasting - - - POC Glucose (Hemocue/NOVA)  Results for orders placed or performed in visit on 11/03/23  POC Glucose (Hemocue/NOVA)  Result Value Ref Range   Glucose, POC 116 70 - 125 mg/dL   Kit/Device Lot # [id number]    Kit/Device Expiration Date 01/2025         Immunization History  Administered Date(s) Administered  . Influenza Vaccine, Quadrivalent, Adjuvanted 04/14/2022  . Influenza, Injectable, Quadrivalent, Preservative Free 01/31/2013  . Influenza, Injectable, Quadrivalent, Preservative free, Pediatric 02/23/2014  . Influenza, Recombinant, Quadrivalent, Injectable, Preservative Free (Egg Free) 04/13/2017  . Influenza, Unspecified 04/08/2018  . Influenza, high-dose, trivalent, PF 03/14/2015, 04/08/2016, 04/19/2018, 02/20/2019, 02/06/2021, 04/25/2022, 03/31/2023  . Influenza, split virus, trivalent, preservative 05/13/2011, 02/24/2012  . Moderna Covid-19, mRNA,LNP-S,PF 12+ Yrs 04/25/2022  . Moderna SARS-CoV-2 Bivalent Booster 6+ yrs 02/06/2021, 02/18/2021  . Moderna SARS-CoV-2 Primary Series 12+ yrs 10/07/2020, 10/19/2020  . Pfizer SARS-CoV-2 Primary Series 12+ yrs 06/19/2019, 07/10/2019, 03/02/2020, 03/14/2020  . Pneumococcal Conjugate 13-Valent  08/23/2013  . Pneumococcal Polysaccharide Vaccine, 23 Valent (PNEUMOVAX-23) 2Y+ 09/19/2009  . RSV, PF (AREXVY) 60Y+ 04/25/2022  . TDAP VACCINE (BOOSTRIX,ADACEL) 7Y+ 02/24/2012, 05/29/2022  . Varicella Zoster [facility]) 18Y+ 04/19/2018, 06/18/2018  . Zoster, Live 05/15/2008     There are no preventive care reminders to display for this patient.   Relevant interim history  ASSESSMENT/PLAN    1. Wellness examination 05/21/2023  OVWELL: This is an evaluation without assigned complexity of medical decision making - Your annual wellness visit was completed today. - Routine education was provided, including living a healthy lifestyle, cancer screening, and vaccinations. Testing, vaccinations and referrals were discussed, and ordered, as appropriate and desired.  - - Tests needed include prostate cancer test (PSA), fasting lipid profile (cholesterol), fasting blood sugar. - - Vaccinations needed include Coronavirus. - Your next wellness visit is due yearly - -    2. Screening for prostate cancer 05/21/2023  OVSCAP:  Screening for prostate cancer has debatable value, but is a standard component of wellness and prevention programs.  - Your risk of prostate cancer is: Normal. Your current symptom status is: no symptoms present. Your current examination revealed: exam deferred, due to patient decision after discussion of evaluation options. - Testing reviewed and offered included: The testing you selected is: Prostate specific antigen (PSA) blood test - Routine education was provided.  - Recheck in 1 year - -       3. Screening for colon cancer 05/21/2023  OVSCACOLON: Screening for colorectal cancer was addressed at this visit - This review found the following information: Your risk of colon cancer is: Normal. Your current symptom status is: no symptoms present. Your current examination revealed: normal findings.  - Your next test is due: none, last 2020 no repeat - Testing reviewed and  offered included: no testing due  to age or medical infirmity, testing stools for blood, testing stools for blood and cellular markers of polyps and cancer, imaging, partial colonoscopy, full colonoscopy.  - The testing you selected is: testing options reviewed, including stool for blood, stool for polyps, imaging and colonoscopy/sigmoidoscopy with selected testing: , no testing due to age or medical infirmity - Routine education was provided.  - Recheck in 1 year - -    4. Screening for diabetes mellitus (DM) 05/21/2023 OVDM: Your fasting glucose level is normal, under 100 mg/dl, at 93 mg/dl. - Recheck in about 1 year. - -    5. Mixed hyperlipidemia 05/21/2023  OVLIPID:  - Routine, medication, diet, exercise, and optimal weight maintenance education was provided. - Recheck in about 1 year - per heart doctor - -    6. Infrarenal abdominal aortic aneurysm (AAA) without rupture (CMD) 7. Aneurysm of iliac artery (CMD) (CARD) 8. 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 Coronary artery disease involving native coronary artery of native heart without angina pectoris (CARD) 10. Essential hypertension (CARD) 05/21/2023 - last notes reviewed - continue with heart doctor - -     78M OV30F: , AMV, WELL, scPROS, (*not colon);, scDM, CBG PSA- -    CC   No chief complaint on file.   Current appointment note:  - 6 month follow up- requesting labs (check blood sugar & recent tick bite)  HPI   History was provided by: patient  LYMPH NODES 11/03/2023  - 2-3 months progressive swelling LN neck and groin - no systemic sx, feverishness, M/F - no other specific sx - prior CT neck, abd and CXR no adenopathy - - TICK BITE 11/03/2023  - 3 weeks ago dark tick left neck, pulled it off, no blood - no assoc sx - worried prior bites causing the LN changes - -  REVIEW OF SYSTEMS   GENERAL symptoms - Overall feels well.  Denies significant malaise, fatigue, fever, chills,  sweats, appetite loss, weight change or other systemic symptom. * malaise/fatigue: but just feels bad - - NOSE/SINUS symptoms - denies: sinus pain, congestion, discharge, bleeding, sneezing, and itching - - ORAL symptoms - Denies: mouth pain, mouth ulcers or sores, tooth pain, gum pain, gum bleeding, tongue pain, tongue ulcers, and tongue surface changes. - - THROAT symptoms - denies: ulcers, sores, throat pain, throat redness, throat swelling, throat exudation, tonsil pain, tonsil redness, tonsil swelling, tonsil exudation, and post nasal discharge. - - RESPIRATORY symptoms - negative: hoarseness, chest pain with breathing, chest congestion/fullness, cough, wheeze, shortness of breath, chest tightness - - CARDIOVASCULAR symptoms - denies: fatigue, swelling, dyspnea, chest pain, cold feet, claudication, palpitations, fast heart beats, fainting. - - GASTROINTESTINAL symptoms - denies jaundice, generalized itching, swallowing difficulty, swallowing pain, reflux, nausea, vomiting, bloating, abdominal pain, loose stools, bloody stools, constipation, anal pain, anal bleeding, anal mass, hemorrhoids - - DERMATOLOGIC symptoms - denies: rash, sores, pustules, nodules, easy bruising, change in moles, and changing or symptomatic skin spots. - - LYMPHATIC symptoms - reviewed: see HPI for details - - URINARY symptoms - denies: dysuria, urgency, frequency, slow stream, hematuria, discharge, incontinence, change in urinary habits, flank pain, suprapubic pain, mass and inguinal herniation. - - MUSCULOSKELETAL symptoms * exceptions: joint pains without inflam changes   EXAMINATION   Vitals:   11/03/23 1625  BP: 124/80  Pulse: 52  SpO2: 98%  Weight: 75.8 kg (167 lb)   Body mass index is 26.16 kg/m.   GENERAL examination - healthy, no distress, awake,  alert, appropriately interactive, and with age-appropriate orientation - - EAR examination The pinnae are well-formed and without  inflammatory changes. Hearing is grossly intact. The EAC's are without obstruction or inflammation. The TM's appear normal, with normal landmarks and light reflex and without inflammation or scarring and without findings of middle ear fluid. There is no visible cholesteoma. * exceptions: none - - NOSE examination There is no significant nasal discharge or obstruction. The septum is straight. The sinuses are non-tender, * exceptions: none - - OP/THROAT examination The teeth are intact and there are no caries evident or dentures are in place. There is no gum inflammation evident. The tongue is without structural changes or inflammation and protrudes in the midline.  The OP is without inflammation, cobblestoning, or post nasal drainage. There is no tonsillar enlargement, asymmetry or inflammation. The salivary glands and appendices are not enlarged or tender. * exceptions: none - - NECK examination - Supple. Trachea midline, no deviation. No palpable adenopathy. Thyroid normal position and size without tenderness or nodularity. No hoarseness, stridor or LT tenderness. * exceptions: none - - RESPIRATORY examination - There is no respiratory distress. There is no cyanosis or clubbing. Chest is symmetric. Percussion does not reveal dullness or hypertympany. Auscultation reveals good exchange and equal breath sounds throughout with no rales, rhonchi, expiratory wheeze, or other adventitious sound. * exceptions: none - - HEART examination - There is no edema. The precordium is quiet. The rhythm is regular and the rate is between 60 and 100. There is no audible murmur, gallop, rub or other extraneous sound. S1 is normal and S2 is physiologically split. S3 and S4 are not heard. * exceptions: none - - GASTROINTESTINAL/ABD examination - The patient is anicteric and there is no pallor. The abdomen is soft, nondistended, nontender and without palpable mass or HSM. Bowel sounds are normal.  There is no  umbilical hernia or inflammation. There is no cutanous vascular engorgement. * exceptions: no palp HSM - - LYMPHATIC examination - No palpable adenopathy neck, supraclavicular, axillae, groins, elbows/upper arms. * exceptions: scattered shotty nodes neck   ---------------------------------------------------------------------------------------------------------------------  MEDS/ALLERGIES   Allergies Amlodipine, Atorvastatin , Lactose, Lisinopril , and Metoprolol   Medications   Current Outpatient Medications:  .  Repatha  SureClick 140 mg/mL pnij, Inject 140 mg under the skin., Disp: , Rfl:  .  aspirin  81 mg EC tablet, Take 81 mg by mouth Once Daily., Disp: , Rfl:  .  azelastine (ASTELIN) 137 mcg (0.1 %) nasal spray, Administer 1 spray into each nostril 2 (two) times a day. Use in each nostril as directed, Disp: 60 mL, Rfl: 1 .  ezetimibe  (ZETIA ) 10 mg tablet, TAKE 1 TABLET BY MOUTH EVERY DAY FOR CHOLESTEROL, Disp: 90 tablet, Rfl: 3 .  ipratropium (ATROVENT) 42 mcg (0.06 %) spry nasal spray, 2 sprays in each nostril 3 times daily, Disp: 15 mL, Rfl: 8 .  losartan  (COZAAR ) 50 mg tablet, Take 50 mg by mouth., Disp: , Rfl:  .  nitroglycerin  (NITROSTAT ) 0.4 mg SL tablet, Place 0.4 mg under the tongue. (Patient not taking: Reported on 10/28/2023), Disp: , Rfl:  .  rosuvastatin  (CRESTOR ) 20 mg tablet, Take 20 mg by mouth Once Daily. (Patient taking differently: Take 10 mg by mouth daily.), Disp: 90 tablet, Rfl: 3 .  triamcinolone acetonide (KENALOG) 0.1 % cream, APPLY TOPICALLY TWICE DAILY AS NEEDED TO INVOLVED AREAS, Disp: , Rfl:   The following portions of the patient's history were reviewed and updated as appropriate: allergies, current mediations, past medial history, past  surgical history, past family history, past social history, and problem list.   Health Maintenance  Topic Date Due  . COVID-19 Vaccine (10 - 2024-25 season) 02/07/2023  . Diabetes Screening  02/29/2024  . Comprehensive  Annual Visit  05/20/2024  . Medicare Annual Wellness (AWV) Subsequent Visits  05/20/2024  . Depression Screening  05/20/2024  . Colorectal Cancer Screening  11/20/2028  . DTaP/Tdap/Td Vaccines (3 - Td or Tdap) 05/29/2032  . Adult RSV (60+ Years or Pregnancy)  Completed  . Influenza Vaccine  Completed  . Pneumococcal Vaccine for Ages 50+  Completed  . ZOSTER VACCINE  Completed  . HIB Vaccines  Aged Out  . Hepatitis B Vaccines  Aged Out  . IPV Vaccines  Aged Out  . Hepatitis A Vaccines  Aged Out  . Meningococcal Conjugate (ACWY) Vaccine  Aged Out  . Rotavirus Vaccines  Aged Out  . HPV Vaccines  Aged Out  . Meningococcal B Vaccine  Aged Out  . Medicare Annual Wellness (AWV) Initial Visit  Discontinued  . Hepatitis C Screening  Discontinued    Patient Active Problem List   Diagnosis Date Noted  . Adenopathy 11/03/2023  . Throat discomfort 10/28/2023  . Allergic rhinitis 10/28/2023  . Chronic pansinusitis 10/14/2022  . COVID-19 virus infection 06/16/2022  . Lymphadenitis 11/28/2021  . Bacterial upper respiratory infection 11/20/2021  . Lumbar strain, initial encounter 01/21/2021  . Erectile dysfunction 04/04/2020  . Diverticulitis 09/04/2019  . Wellness examination 03/27/2019  . Screening for prostate cancer 03/27/2019  . Screening for colon cancer 03/27/2019  . Family history of premature coronary artery disease 10/02/2018  . Screening for diabetes mellitus (DM) 09/28/2018  . Essential hypertension (CARD) 09/27/2018  . Mixed hyperlipidemia (CARDS) 09/27/2018  . Coronary artery disease involving native coronary artery of native heart without angina pectoris (CARD) 09/27/2018  . Aneurysm of infrarenal abdominal aorta (HCC) (CARD) 09/27/2018  . GERD (gastroesophageal reflux disease) 09/27/2018  . Irritable bowel syndrome 09/27/2018  . Diverticulosis of colon 09/27/2018  . Childhood asthma without complication 09/27/2018  . Colon polyp 09/27/2018  . Aneurysm of right common  iliac artery (HCC) (CARD) 03/27/2013  . Aneurysm of iliac artery (CMD) (CARD) 03/27/2013  . Ventral hernia 01/06/2013    History reviewed. No pertinent past medical history.  Past Surgical History:  Procedure Laterality Date  . ABDOMINAL AORTIC ANEURYSM REPAIR, ENDOVASCULAR N/A 2019   Procedure: ABDOMINAL AORTIC ANEURYSM REPAIR, ENDOVASCULAR  . CORONARY ANGIOPLASTY WITH STENT PLACEMENT N/A 1997   Procedure: CORONARY ANGIOPLASTY WITH STENT PLACEMENT  . ILIAC ARTERY ANEURYSM REPAIR Right 2019   Procedure: ILIAC ARTERY ANEURYSM REPAIR  . KNEE ARTHROSCOPY Left 2002   Procedure: KNEE ARTHROSCOPY  . KNEE ARTHROSCOPY W/ MENISCECTOMY Right 1985   Procedure: KNEE ARTHROSCOPY W/ MENISCECTOMY  . LAPAROSCOPIC CHOLECYSTECTOMY N/A 1990   Procedure: LAPAROSCOPIC CHOLECYSTECTOMY    Social History   Tobacco Use  . Smoking status: Former    Current packs/day: 0.00    Average packs/day: 1 pack/day for 41.0 years (41.0 ttl pk-yrs)    Types: Cigarettes    Start date: 06/09/1959    Quit date: 06/08/2000    Years since quitting: 23.4  . Smokeless tobacco: Former    Family History  Problem Relation Name Age of Onset  . Heart disease Mother    . Breast cancer Mother    . Kidney cancer Father    . Glomerulonephritis Brother         kidney transplant x 2  . Coronary artery  disease Brother  30       CABG  . Colon cancer Neg Hx    . Prostate cancer Neg Hx      Scribe: This document serves as a record of services personally performed by Dr Lamar LITTIE Ofilia Mickey MD.  It was created on their behalf by Joen Macario Reeve, CMA, a trained medical scribe, and Certified Medical Assistant (CMA). During the course of documenting the history, physical exam and medical decision making, I was functioning as a Stage manager. The creation of this record is the provider's dictation and/or activities during the visit.  Electronically signed by Joen Macario Reeve, CMA 11/03/2023 8:10 AM   Lamar LITTIE Ofilia, MD  Provider:  I agree the documentation is accurate and complete.  Electronically signed by: Lamar LITTIE Ofilia, MD 11/03/2023 4:37 PM

## 2023-11-09 NOTE — Telephone Encounter (Signed)
 Patient is returning missed call. Advised that someone from clinical team would reach out regarding results.   Call back #: [phone]

## 2023-11-10 NOTE — Telephone Encounter (Signed)
 Spoke to patient.  Patient advised that appt's seem to be with [facility] and from what I can tell from a telephone message with [facility] in March, [facility] has scheduled these appt's.  Patient advised to contact [facility] regarding these appt's

## 2023-11-10 NOTE — Telephone Encounter (Signed)
 Copied from CRM #[id number]. Topic: Information Request - General Information Request/Update >> Nov 10, 2023  9:54 AM Suzen DASEN wrote: Kevin Blake called to request or inform practice of non-symptomatic information. Unable to resolve patient request per standard work or protocols. Message sent to office as appropriate. [facility] [address] [facility] [place] Of[place] >> Nov 10, 2023  9:58 AM Suzen DASEN wrote: Kevin Blake is calling other request    Include all details related to the request(s) below: Patient is calling to speak to the clinical staff in regards to a text message stating that he has some appointments scheduled at [facility] on 11/15/23. Patient stated that he reached out to [facility] and can not get an answer about these appointments. Patient would like to know if the provider has ordered any test at this time. Unable to resolve patient request per standard work or protocols. Message sent to office as appropriate.  Confirm and type the Best Contact Number below:  Patient/caller contact number:   [phone]          [] Home  [x] Mobile  [] Work  []  Other   [x]  Okay to leave a voicemail   Medication List:  Current Outpatient Medications:  .  aspirin  81 mg EC tablet, Take 81 mg by mouth Once Daily., Disp: , Rfl:  .  azelastine (ASTELIN) 137 mcg (0.1 %) nasal spray, Administer 1 spray into each nostril 2 (two) times a day. Use in each nostril as directed, Disp: 60 mL, Rfl: 1 .  ezetimibe  (ZETIA ) 10 mg tablet, TAKE 1 TABLET BY MOUTH EVERY DAY FOR CHOLESTEROL, Disp: 90 tablet, Rfl: 3 .  ipratropium (ATROVENT) 42 mcg (0.06 %) spry nasal spray, 2 sprays in each nostril 3 times daily, Disp: 15 mL, Rfl: 8 .  losartan  (COZAAR ) 50 mg tablet, Take 50 mg by mouth., Disp: , Rfl:  .  nitroglycerin  (NITROSTAT ) 0.4 mg SL tablet, Place 0.4 mg under the tongue. (Patient not taking: Reported on 10/28/2023), Disp: , Rfl:  .  Repatha  SureClick 140 mg/mL pnij, Inject 140 mg under the skin.,  Disp: , Rfl:  .  rosuvastatin  (CRESTOR ) 20 mg tablet, Take 20 mg by mouth Once Daily. (Patient taking differently: Take 10 mg by mouth daily.), Disp: 90 tablet, Rfl: 3 .  triamcinolone acetonide (KENALOG) 0.1 % cream, APPLY TOPICALLY TWICE DAILY AS NEEDED TO INVOLVED AREAS, Disp: , Rfl:      Medication Request/Refills: Pharmacy Information (if applicable)   [x]  Not Applicable       []  Pharmacy listed  Send Medication Request to:                                                 []  Pharmacy not listed (added to pharmacy list in Epic) Send Medication Request to:      Listed Pharmacies: [id number], [place] - [address] AT [address] - PHONE: [phone] - FAX: [phone]

## 2023-11-10 NOTE — Telephone Encounter (Signed)
 [facility]

## 2023-11-10 NOTE — Progress Notes (Signed)
 Spoke to patient and he states he has not had a fever since the night of 11/08/23.

## 2023-11-15 ENCOUNTER — Ambulatory Visit: Payer: Medicare PPO | Admitting: Surgery

## 2023-11-15 ENCOUNTER — Ambulatory Visit (HOSPITAL_COMMUNITY): Payer: Medicare PPO

## 2023-11-26 NOTE — ED Provider Notes (Signed)
 ------------------------------------------------------------------------------- Attestation signed by Candis Camellia Mussel, MD at 11/26/23 1827 I personally reviewed the note written by the PA and take responsibility for the management plan.  -------------------------------------------------------------------------------  [facility] Emergency Department Provider Note  ED Clinical Impression   Final diagnoses:  Nonintractable headache, unspecified chronicity pattern, unspecified headache type (Primary)    ED Assessment/Plan & Course   78 year old male with past medical history of CAD, AAA, diverticulitis, IBS presenting valuation for headache, flulike symptoms over the past 2 weeks.  On exam patient is well-appearing in no acute distress.  No nuchal rigidity.  No significant lymphadenopathy on exam.  No tracheal deviation.  Differential diagnosis includes but is not limited to viral illness, rickettsial disease infection, meningitis, neck mass.  CBC and CMP unremarkable.  Suspect rickettsial disease given patient reports history of tick bite about a week prior to onset of symptoms and with headache and flulike symptoms.  We will treat empirically with doxycycline, rickettsial labs pending.  Rationale, treatment, expected course, follow-up return precautions reviewed.  Portions of this record have been created using Scientist, clinical (histocompatibility and immunogenetics). Dictation errors have been sought, but may not have been identified and corrected.    ____________________________________________   History   Chief Complaint  Headache New Onset or New Symptoms and Sore Throat   HPI  Kevin Blake is a 78 y.o. male with a past medical history of CAD s/p stent, AAA s/p repair, diverticulitis, and IBS, who presents to the ED for evaluation of progressive throat pain, stiff neck, and headache, which began 1 week ago. He describes his headache as a dull pain to posterior head. Also endorses occasional  cough. No runny nose, fever, body aches, joint pain, trouble swallowing, nausea, or vomiting. He reports pulling a small tick off of his neck 2-3 weeks ago.   He came to ED today because he has a beach trip planned and he wanted to be evaluated before he leaves.   Past Medical History[1]  Past Surgical History[2]  No current facility-administered medications for this encounter.  Current Outpatient Medications:  .  aspirin , buffered 81 mg Tab, Take 81 mg by mouth., Disp: , Rfl:  .  cloNIDine HCl (CATAPRES) 0.1 MG tablet, Take 1 tablet (0.1 mg total) by mouth Two (2) times a day., Disp: 60 tablet, Rfl: 0 .  DOCOSAHEXANOIC ACID/EPA (FISH OIL ORAL), Take by mouth., Disp: , Rfl:  .  doxycycline (VIBRA-TABS) 100 MG tablet, Take 1 tablet (100 mg total) by mouth two (2) times a day for 7 days., Disp: 14 tablet, Rfl: 0 .  ezetimibe  (ZETIA ) 10 mg tablet, TAKE 1 TABLET BY MOUTH EVERY DAY FOR CHOLESTEROL, Disp: , Rfl:  .  lidocaine  (XYLOCAINE ) 2 % Soln, 10 mL by Mouth route every four (4) hours as needed., Disp: 100 mL, Rfl: 0 .  losartan  (COZAAR ) 100 MG tablet, Take 100 mg by mouth daily., Disp: , Rfl:  .  metroNIDAZOLE (FLAGYL) 500 MG tablet, , Disp: , Rfl:  .  rosuvastatin  (CRESTOR ) 40 MG tablet, Take 40 mg by mouth daily., Disp: , Rfl:   Allergies Norvasc [amlodipine]  Family History[3]  Social History Short Social History[4]  Review of Systems Review of Systems  Constitutional:  Negative for fever.  HENT:  Positive for sore throat. Negative for rhinorrhea and trouble swallowing.   Respiratory:  Positive for cough.   Gastrointestinal:  Negative for nausea and vomiting.  Musculoskeletal:  Positive for neck pain and neck stiffness. Negative for arthralgias.  Neurological:  Positive  for headaches.  All other systems reviewed and are negative.   Physical Exam   ED Triage Vitals  Enc Vitals Group     BP 11/26/23 1522 144/68     Pulse 11/26/23 1522 58     SpO2 Pulse --      Resp  11/26/23 1522 18     Temp 11/26/23 1521 36.3 C (97.4 F)     Temp Source 11/26/23 1521 Temporal     SpO2 11/26/23 1522 97 %     Weight 11/26/23 1521 71.7 kg (158 lb)     Height 11/26/23 1521 1.727 m (5' 8)   Physical Exam Vitals and nursing note reviewed.  Constitutional:      General: He is not in acute distress.    Appearance: He is not ill-appearing, toxic-appearing or diaphoretic.  HENT:     Head: Normocephalic and atraumatic.     Right Ear: Tympanic membrane normal.     Left Ear: Tympanic membrane normal.     Mouth/Throat:     Mouth: Mucous membranes are moist.     Pharynx: Oropharynx is clear.     Tonsils: No tonsillar exudate or tonsillar abscesses.  Neck:     Thyroid: No thyromegaly.   Cardiovascular:     Rate and Rhythm: Normal rate and regular rhythm.     Heart sounds: Normal heart sounds.  Pulmonary:     Effort: Pulmonary effort is normal.     Breath sounds: Normal breath sounds.  Abdominal:     Palpations: Abdomen is soft.     Tenderness: There is no abdominal tenderness.   Musculoskeletal:     Cervical back: Normal range of motion and neck supple. No rigidity.  Lymphadenopathy:     Cervical: No cervical adenopathy.   Skin:    General: Skin is warm and dry.     Capillary Refill: Capillary refill takes less than 2 seconds.   Neurological:     General: No focal deficit present.     Mental Status: He is alert and oriented to person, place, and time.   Psychiatric:        Mood and Affect: Mood normal.        Behavior: Behavior normal.      EKG    Labs   Results for orders placed or performed during the hospital encounter of 11/26/23  Comprehensive Metabolic Panel  Result Value Ref Range   Sodium 139 135 - 145 mmol/L   Potassium 4.3 3.5 - 5.0 mmol/L   Chloride 106 98 - 107 mmol/L   CO2 24.0 22.0 - 32.0 mmol/L   Anion Gap 9 5 - 14 mmol/L   BUN 15 7 - 21 mg/dL   Creatinine 9.19 9.29 - 1.30 mg/dL   BUN/Creatinine Ratio 19    eGFR CKD-EPI  (2021) Male 90 >=60 mL/min/1.67m2   Glucose 130 (H) 74 - 106 mg/dL   Calcium  8.8 8.5 - 10.2 mg/dL   Albumin 4.0 3.4 - 5.0 g/dL   Total Protein 6.0 (L) 6.5 - 8.3 g/dL   Total Bilirubin 0.3 0.1 - 1.2 mg/dL   AST 28 19 - 55 U/L   ALT 26 <50 U/L   Alkaline Phosphatase 72 38 - 126 U/L  CBC w/ Differential  Result Value Ref Range   WBC 5.7 3.6 - 11.2 10*9/L   RBC 4.37 4.26 - 5.60 10*12/L   HGB 13.3 12.9 - 16.5 g/dL   HCT 61.1 (L) 60.9 - 51.9 %   MCV 88.7  77.6 - 95.7 fL   MCH 30.3 25.9 - 32.4 pg   MCHC 34.2 32.0 - 36.0 g/dL   RDW 86.6 87.7 - 84.7 %   MPV 8.6 6.8 - 10.7 fL   Platelet 227 150 - 450 10*9/L   nRBC 0 <=4 /100 WBCs   Neutrophils % 62.9 %   Lymphocytes % 21.9 %   Monocytes % 9.7 %   Eosinophils % 4.3 %   Basophils % 1.2 %   Absolute Neutrophils 3.6 1.8 - 7.8 10*9/L   Absolute Lymphocytes 1.2 1.1 - 3.6 10*9/L   Absolute Monocytes 0.6 0.3 - 0.8 10*9/L   Absolute Eosinophils 0.2 0.0 - 0.5 10*9/L   Absolute Basophils 0.1 0.0 - 0.1 10*9/L    Radiology   No orders to display    Procedures    Portions of this record have been created using Scientist, clinical (histocompatibility and immunogenetics). Dictation errors have been sought, but may not have been identified and corrected.  The chart was modified by Eda Cree using scribe services on November 26, 2023 4:07 PM, on behalf of Carlin Hopping, PA-C.  Documentation assistance was provided by the scribe in my presence. The documentation recorded by the scribe has been reviewed by me, amended with [employer], and accurately reflects the services I personally performed.       [1] Past Medical History: Diagnosis Date  . Abdominal aortic aneurysm (AAA)   . CAD (coronary artery disease)   . Diverticulitis   . IBS (irritable bowel syndrome)   [2] Past Surgical History: Procedure Laterality Date  . ABDOMINAL AORTIC ANEURYSM REPAIR    . CHOLECYSTECTOMY    . CORONARY ANGIOPLASTY WITH STENT PLACEMENT    . ELBOW SURGERY    . KNEE SURGERY     [3] History reviewed. No pertinent family history. [4] Social History Tobacco Use  . Smoking status: Former  . Smokeless tobacco: Never  Substance Use Topics  . Alcohol use: Yes    Comment: rarely  . Drug use: No   Hopping Carlin Hensen, [place] 11/26/23 [phone]

## 2023-12-01 NOTE — Progress Notes (Signed)
 [facility] Transitional Care Management     Situation   Kevin Blake is a 78 y.o. male who was contacted today for an ED outreach.  Admission Date: 11/26/23   Discharge Date:  11/26/23 ( ED visit only)   Institution: [facility] ED  Diagnosis:      Headache, unspecified    Is this visit eligible for TCM? No ( ED visit only)  Background   12/01/23 initial ED outreach x 1st attempt. HN called patient home # [phone] and left message for patient to return my phone call. HN called patient mobile # [phone] and left message for patient to return my call or call PCP office if any new issues or concerns.   12/02/23 Initial ED outreach x 2nd attempt. HN called patient home # [phone] and left message for patient to return my phone call or call PCP office if any new issues or concerns.   Since Discharge: HN received notification that patient went to [facility] ED on 11/26/23 for Headache, unspecified. HN reviewed discharge note and patient was given lidocaine  2% take 10 mg by mouth every 4 hours as needed and doxycycline 100 mg 1 po bid x 7 days.  HN unable to reach patient for ED follow up. No further HN services warranted at this time.   Primary Care Provider on Record: Lamar LITTIE Hobby, MD    PCP/specialist notified: No  Referral Made: No  Referrals made to other disciplines: None   Future Appointments  Date Time Provider Department Center  05/22/2024 10:30 AM Lamar LITTIE Hobby, MD [facility] [address]      Jon Sharps, RN [facility] [phone]   Electronically signed by: Jon Earnie Sharps, RN 12/01/2023 8:34 AM

## 2024-01-21 ENCOUNTER — Other Ambulatory Visit: Payer: Self-pay | Admitting: Surgery

## 2024-01-24 ENCOUNTER — Ambulatory Visit (HOSPITAL_COMMUNITY)
Admission: RE | Admit: 2024-01-24 | Discharge: 2024-01-24 | Disposition: A | Discharge status: Home / Self Care | Source: Ambulatory Visit | Attending: Surgery | Admitting: Surgery

## 2024-01-24 ENCOUNTER — Encounter: Payer: Self-pay | Admitting: Surgery

## 2024-01-24 ENCOUNTER — Other Ambulatory Visit: Payer: Self-pay | Admitting: Surgery

## 2024-01-24 ENCOUNTER — Ambulatory Visit: Admitting: Surgery

## 2024-01-24 VITALS — BP 171/83 | HR 52 | Temp 97.7°F | Resp 18 | Ht 67.0 in | Wt 165.0 lb

## 2024-01-24 DIAGNOSIS — I7143 Infrarenal abdominal aortic aneurysm, without rupture: Secondary | ICD-10-CM | POA: Insufficient documentation

## 2024-01-24 DIAGNOSIS — I714 Abdominal aortic aneurysm, without rupture, unspecified: Secondary | ICD-10-CM | POA: Insufficient documentation

## 2024-01-24 DIAGNOSIS — K7689 Other specified diseases of liver: Secondary | ICD-10-CM

## 2024-01-24 NOTE — Progress Notes (Signed)
 [facility]ist [facility]  Patient name: Kevin Blake MRN: [id number] DOB: 08-27-1945 Sex: male   REASON FOR VISIT:    Follow up  HISOTRY OF PRESENT ILLNESS:   Kevin Blake is a 78 y.o. 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  His maximum aortic diameter was 4.0 cm.  The right common iliac aneurysm measured 3.3 cm. His postoperative course was uncomplicated.  He had very minimal right buttock symptoms.  Whatever symptoms he had have completely resolved.  He is back today for follow-up.  He has no complaints.  He takes a statin for hypercholesterolemia.  He is on ARB for hypertension.  He is a former smoker.   PAST MEDICAL HISTORY:   Past Medical History:  Diagnosis Date   AAA (abdominal aortic aneurysm) (HCC)    Allergy    Arthritis    Asthma    as a child   CAD (coronary artery disease)    s/p angioplasty   Cataract    removed both eyes   Chronic abdominal pain    RUQ   Colon polyps    Diverticulosis    GERD (gastroesophageal reflux disease)    pt states im[proved    History of atrial fibrillation    when recieved a stent    HLD (hyperlipidemia)    HTN (hypertension)    IBS (irritable bowel syndrome)    Skin cancer      FAMILY HISTORY:   Family History  Problem Relation Age of Onset   Heart disease Mother    Breast cancer Mother    AAA (abdominal aortic aneurysm) Mother    Kidney cancer Father        reoccurance 30 yrs later and mets all over body    Heart disease Brother        before age 9   Hyperlipidemia Brother    Hypertension Brother    Heart disease Son    Liver disease Brother    Kidney disease Brother    Colon cancer Neg Hx    Colon polyps Neg Hx    Esophageal cancer Neg Hx    Rectal cancer Neg Hx    Stomach cancer Neg Hx     SOCIAL HISTORY:    Social History   Tobacco Use   Smoking status: Former    Current packs/day: 0.00    Types: Cigarettes    Quit date: 06/08/2000    Years since quitting: 23.6   Smokeless tobacco: Former  Substance Use Topics   Alcohol use: Yes    Alcohol/week: 0.0 standard drinks of alcohol    Comment: occ beer      ALLERGIES:   Allergies  Allergen Reactions   Amlodipine Swelling   Lipitor [Atorvastatin ]     gen muscle pain   Lisinopril  Swelling   Metoprolol  Other (See Comments)    Avoid BB due to RBBB and bradycardia   Lactose Intolerance (Gi)     Bloating and gas     CURRENT MEDICATIONS:   Current Outpatient Medications  Medication Sig Dispense Refill   aspirin  81 MG tablet Take 81 mg by mouth daily.       Evolocumab  (REPATHA  SURECLICK) 140 MG/ML SOAJ Inject 140 mg into the skin every 14 (fourteen) days. 6 mL 3   ezetimibe  (ZETIA ) 10 MG tablet Take 1 tablet (10 mg total) by mouth  daily. 30 tablet 11   losartan  (COZAAR ) 50 MG tablet Take 1 tablet (50 mg total) by mouth daily.     nitroGLYCERIN  (NITROSTAT ) 0.4 MG SL tablet Place 1 tablet (0.4 mg total) under the tongue every 5 (five) minutes as needed. 25 tablet 3   rosuvastatin  (CRESTOR ) 20 MG tablet Take 0.5 tablets (10 mg total) by mouth daily.     No current facility-administered medications for this visit.    REVIEW OF SYSTEMS:   [X]  denotes positive finding, [ ]  denotes negative finding Cardiac  Comments:  Chest pain or chest pressure:    Shortness of breath upon exertion:    Short of breath when lying flat:    Irregular heart rhythm:        Vascular    Pain in calf, thigh, or hip brought on by ambulation:    Pain in feet at night that wakes you up from your sleep:     Blood clot in your veins:    Leg swelling:         Pulmonary    Oxygen at home:    Productive cough:     Wheezing:         Neurologic    Sudden weakness in arms or legs:     Sudden numbness in arms or legs:     Sudden onset of difficulty  speaking or slurred speech:    Temporary loss of vision in one eye:     Problems with dizziness:         Gastrointestinal    Blood in stool:     Vomited blood:         Genitourinary    Burning when urinating:     Blood in urine:        Psychiatric    Major depression:         Hematologic    Bleeding problems:    Problems with blood clotting too easily:        Skin    Rashes or ulcers:        Constitutional    Fever or chills:      PHYSICAL EXAM:   Vitals:   01/24/24 1026  BP: (!) 171/83  Pulse: (!) 52  Resp: 18  Temp: 97.7 F (36.5 C)  TempSrc: Temporal  SpO2: 98%  Weight: 165 lb (74.8 kg)  Height: 5' 7 (1.702 m)    GENERAL: The patient is a well-nourished male, in no acute distress. The vital signs are documented above. CARDIAC: There is a regular rate and rhythm.  VASCULAR: Palpable posterior tibial pulse PULMONARY: Non-labored respirations ABDOMEN: Soft and non-tender with normal pitched bowel sounds.  MUSCULOSKELETAL: There are no major deformities or cyanosis. NEUROLOGIC: No focal weakness or paresthesias are detected. SKIN: There are no ulcers or rashes noted. PSYCHIATRIC: The patient has a normal affect.  STUDIES:   I have reviewed the following: Abdominal Aorta: The largest aortic measurement is 4.3 cm. The largest  aortic diameter has increased compared to prior exam. Previous diameter  measurement was 3.9 cm obtained on 05/03/2023. Unable to definitively  identify endoleak by color and PW  Doppler.   MEDICAL ISSUES:   AAA: Slight interval enlargement of abdominal aortic aneurysm at 4.3 cm with no obvious endoleak.  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  This could not be seen by ultrasound.  I discussed getting a CT scan in 1 year to follow-up.  Malvina Serene CLORE, MD, FACS [facility] [phone] Pager [phone]

## 2024-02-22 ENCOUNTER — Other Ambulatory Visit: Payer: Self-pay | Admitting: Thoracic Surgery (Cardiothoracic Vascular Surgery)

## 2024-02-22 ENCOUNTER — Ambulatory Visit
Attending: Thoracic Surgery (Cardiothoracic Vascular Surgery) | Admitting: Thoracic Surgery (Cardiothoracic Vascular Surgery)

## 2024-02-22 ENCOUNTER — Encounter: Payer: Self-pay | Admitting: Thoracic Surgery (Cardiothoracic Vascular Surgery)

## 2024-02-22 VITALS — BP 160/80 | HR 55 | Resp 20 | Ht 67.0 in | Wt 166.0 lb

## 2024-02-22 DIAGNOSIS — R911 Solitary pulmonary nodule: Secondary | ICD-10-CM

## 2024-02-22 NOTE — Progress Notes (Signed)
 PCP is Dough, Lamar CROME, MD Referring Provider is Vernetta Berg, MD  Chief Complaint  Patient presents with   Lung Lesion    Surgical consult/ Chest CT 11/21/23- Imaging in Pac's    HPI: Kevin Blake sent for consultation regarding a cystic right lower lobe lung lesion.  Kevin Blake is a 78 year old man with a past history of tobacco use, hypertension, hyperlipidemia, CAD, PTCA with stent in 1997, abdominal aortic aneurysm, stent graft 2019, reflux, skin cancer, and arthritis.  He smoked about a pack a day from age 69 to around 82 (35-pack-year).  Quit about 25 to 30 years ago.  He had a CT of the chest in June.  Wife says it was done because of his smoking history.  It showed a 10 x 13 mm cystic lesion at the base of the right lower lobe.  He recently saw Dr. Vernetta regarding a ventral hernia.  He requested to be referred to someone regarding his lung nodule.  He denies any chest pain, pressure, tightness, or shortness of breath.  He has not had a change in appetite or unintentional weight loss.  Does have some arthritis but otherwise feels well.  Past Medical History:  Diagnosis Date   AAA (abdominal aortic aneurysm) (HCC)    Allergy    Arthritis    Asthma    as a child   CAD (coronary artery disease)    s/p angioplasty   Cataract    removed both eyes   Chronic abdominal pain    RUQ   Colon polyps    Diverticulosis    GERD (gastroesophageal reflux disease)    pt states im[proved    History of atrial fibrillation    when recieved a stent    HLD (hyperlipidemia)    HTN (hypertension)    IBS (irritable bowel syndrome)    Skin cancer     Past Surgical History:  Procedure Laterality Date   ABDOMINAL AORTIC ENDOVASCULAR STENT GRAFT N/A 07/14/2017   Procedure: ABDOMINAL AORTIC ENDOVASCULAR STENT GRAFT, INSERTION WITH RIGHT ILIAC BRANCH DEVICE;  Surgeon: Serene Gaile ORN, MD;  Location: [facility] OR;  Service: Vascular;  Laterality: N/A;   CARDIAC CATHETERIZATION  1997    CHOLECYSTECTOMY     COLONOSCOPY     COLONOSCOPY     CORONARY ANGIOPLASTY WITH STENT PLACEMENT  1997   EXPLORATORY LAPAROTOMY     EYE SURGERY     cataracts   KNEE SURGERY     POLYPECTOMY     TENDON REPAIR      Family History  Problem Relation Age of Onset   Heart disease Mother    Breast cancer Mother    AAA (abdominal aortic aneurysm) Mother    Kidney cancer Father        reoccurance 30 yrs later and mets all over body    Heart disease Brother        before age 59   Hyperlipidemia Brother    Hypertension Brother    Heart disease Son    Liver disease Brother    Kidney disease Brother    Colon cancer Neg Hx    Colon polyps Neg Hx    Esophageal cancer Neg Hx    Rectal cancer Neg Hx    Stomach cancer Neg Hx     Social History Social History   Tobacco Use   Smoking status: Former    Current packs/day: 0.00    Types: Cigarettes    Quit date: 06/08/2000  Years since quitting: 23.7   Smokeless tobacco: Former  Building services engineer status: Never Used  Substance Use Topics   Alcohol use: Yes    Alcohol/week: 0.0 standard drinks of alcohol    Comment: occ beer    Drug use: No    Current Outpatient Medications  Medication Sig Dispense Refill   aspirin  81 MG tablet Take 81 mg by mouth daily.       Evolocumab  (REPATHA  SURECLICK) 140 MG/ML SOAJ Inject 140 mg into the skin every 14 (fourteen) days. 6 mL 3   ezetimibe  (ZETIA ) 10 MG tablet Take 1 tablet (10 mg total) by mouth daily. 30 tablet 11   losartan  (COZAAR ) 50 MG tablet Take 1 tablet (50 mg total) by mouth daily.     nitroGLYCERIN  (NITROSTAT ) 0.4 MG SL tablet Place 1 tablet (0.4 mg total) under the tongue every 5 (five) minutes as needed. 25 tablet 3   rosuvastatin  (CRESTOR ) 20 MG tablet Take 0.5 tablets (10 mg total) by mouth daily.     No current facility-administered medications for this visit.    Allergies  Allergen Reactions   Amlodipine Swelling   Lipitor [Atorvastatin ]     gen muscle pain   Lisinopril   Swelling   Metoprolol  Other (See Comments)    Avoid BB due to RBBB and bradycardia   Lactose Other (See Comments) and Nausea Only    Bloating and gas   Lactose Intolerance (Gi)     Bloating and gas    Review of Systems  Constitutional:  Negative for activity change, fatigue and unexpected weight change.  HENT:  Negative for trouble swallowing and voice change.   Respiratory:  Negative for cough, shortness of breath and wheezing.   Cardiovascular:  Negative for chest pain and leg swelling.  Musculoskeletal:  Positive for arthralgias and joint swelling.  All other systems reviewed and are negative.   BP (!) 160/80   Pulse (!) 55   Resp 20   Ht 5' 7 (1.702 m)   Wt 166 lb (75.3 kg)   SpO2 97% Comment: RA  BMI 26.00 kg/m  Physical Exam Vitals reviewed.  Constitutional:      General: He is not in acute distress.    Appearance: Normal appearance.  HENT:     Head: Normocephalic and atraumatic.  Eyes:     General: No scleral icterus.    Extraocular Movements: Extraocular movements intact.  Neck:     Vascular: No carotid bruit.  Cardiovascular:     Rate and Rhythm: Normal rate and regular rhythm.     Heart sounds: Normal heart sounds. No murmur heard. Pulmonary:     Effort: Pulmonary effort is normal. No respiratory distress.     Breath sounds: Normal breath sounds.  Abdominal:     Palpations: Abdomen is soft.  Lymphadenopathy:     Cervical: No cervical adenopathy.  Skin:    General: Skin is warm and dry.  Neurological:     General: No focal deficit present.     Mental Status: He is alert and oriented to person, place, and time.     Cranial Nerves: No cranial nerve deficit.     Motor: No weakness.     Diagnostic Tests: I personally reviewed the CT images from [facility] that are available through our PACS system.  There is a 10 x 13 mm cystic lesion inferior right lower lobe with variable wall thickness.  No hilar or mediastinal  adenopathy.  Impression: Kevin Blake is  a 78 year old man with a past history of tobacco use, hypertension, hyperlipidemia, CAD, PTCA with stent in 1997, abdominal aortic aneurysm, stent graft 2019, reflux, skin cancer, arthritis, and a cystic lesion of the right lower lobe.  Right lower lobe cystic lesion-measures about 1 x 1.3 cm.  Has some eccentric wall thickening.  Cannot rule out malignancy.  For step is an interval follow-up.  It has been about 3 months since his scan so we will go ahead and get that now.  Will do a super D protocol in case robotic bronchoscopy is necessary.  He does have an old CT from 2019 where there is a vague hint of something in that vicinity but technique is different and cannot really tell if there is any definite lesion there.  Plan: Return in 3 weeks with CT chest, super D protocol to follow-up cystic right lower lobe lesion.  I spent over 30 minutes in review of records, images, and in consultation with Kevin Blake today Elspeth JAYSON Millers, MD [facility] [phone]

## 2024-02-24 ENCOUNTER — Encounter: Payer: Self-pay | Admitting: Family Medicine

## 2024-02-29 ENCOUNTER — Ambulatory Visit
Admission: RE | Admit: 2024-02-29 | Discharge: 2024-02-29 | Disposition: A | Discharge status: Home / Self Care | Source: Ambulatory Visit | Attending: Surgery | Admitting: Surgery

## 2024-02-29 DIAGNOSIS — K7689 Other specified diseases of liver: Secondary | ICD-10-CM

## 2024-02-29 MED ORDER — GADOPICLENOL 0.5 MMOL/ML IV SOLN
7.5000 mL | Freq: Once | INTRAVENOUS | Status: AC | PRN
Start: 2024-02-29 — End: 2024-02-29
  Administered 2024-02-29: 7.5 mL via INTRAVENOUS

## 2024-03-01 ENCOUNTER — Ambulatory Visit (HOSPITAL_COMMUNITY)
Admission: RE | Admit: 2024-03-01 | Discharge: 2024-03-01 | Disposition: A | Discharge status: Home / Self Care | Source: Ambulatory Visit | Attending: Thoracic Surgery (Cardiothoracic Vascular Surgery) | Admitting: Thoracic Surgery (Cardiothoracic Vascular Surgery)

## 2024-03-01 DIAGNOSIS — R911 Solitary pulmonary nodule: Secondary | ICD-10-CM | POA: Diagnosis present

## 2024-03-14 ENCOUNTER — Ambulatory Visit
Attending: Thoracic Surgery (Cardiothoracic Vascular Surgery) | Admitting: Thoracic Surgery (Cardiothoracic Vascular Surgery)

## 2024-03-14 ENCOUNTER — Encounter: Payer: Self-pay | Admitting: Thoracic Surgery (Cardiothoracic Vascular Surgery)

## 2024-03-14 VITALS — BP 164/85 | HR 50 | Resp 20 | Ht 67.0 in | Wt 166.4 lb

## 2024-03-14 DIAGNOSIS — R911 Solitary pulmonary nodule: Secondary | ICD-10-CM

## 2024-03-14 NOTE — Progress Notes (Signed)
 [address] [place] [address]             [phone]     HPI: Kevin Blake returns regarding his right lower lobe lung nodule  Kevin Blake is a 78 year old man with a history of tobacco use, hypertension, hyperlipidemia, coronary disease, stent in 1997, abdominal aneurysm, stent graft 2019, reflux, skin cancer, arthritis, ventral hernia, and a right lower lobe lung nodule.  He smoked a pack a day for 35 years.  Quit 25 to 30 years ago.  He had a CT of his chest in June which showed a 10 x 13 mm cystic lesion at the base of the right lower lobe.  When I saw him in September to been 3 months so we recommended a repeat CT.  No chest pain, pressure, tightness, or shortness of breath.  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       Evolocumab  (REPATHA  SURECLICK) 140 MG/ML SOAJ Inject 140 mg into the skin every 14 (fourteen) days. 6 mL 3   ezetimibe  (ZETIA ) 10 MG tablet Take 1 tablet (10 mg total) by mouth daily. 30 tablet 11   losartan  (COZAAR ) 50 MG tablet Take 1 tablet (50 mg total) by mouth daily.     nitroGLYCERIN  (NITROSTAT ) 0.4  MG SL tablet Place 1 tablet (0.4 mg total) under the tongue every 5 (five) minutes as needed. 25 tablet 3   rosuvastatin  (CRESTOR ) 20 MG tablet Take 0.5 tablets (10 mg total) by mouth daily.     No current facility-administered medications for this visit.    Physical Exam BP (!) 164/85 (BP Location: Right Arm, Patient Position: Sitting, Cuff Size: Normal)   Pulse (!) 50   Resp 20   Ht 5' 7 (1.702 m)   Wt 166 lb 6.4 oz (75.5 kg)   SpO2 97% Comment: RA  BMI 26.57 kg/m  78 year old man in no acute distress Alert and oriented x 3 with no focal deficits  Diagnostic Tests: CT CHEST WITHOUT CONTRAST   TECHNIQUE: Multidetector CT imaging of the chest was performed using thin slice collimation for electromagnetic bronchoscopy planning purposes, without intravenous contrast.   RADIATION DOSE REDUCTION: This exam was performed according to the departmental dose-optimization program which includes automated exposure control, adjustment of the mA and/or kV according to patient size and/or use of iterative reconstruction technique.   COMPARISON:  11/19/2023   FINDINGS: Cardiovascular: Coronary, aortic arch, and branch vessel atherosclerotic vascular disease.   Mediastinum/Nodes: Unremarkable   Lungs/Pleura: Faint tree-in-bud reticulonodular opacities posteriorly in the right upper lobe on image 39 series 302 is a favoring atypical infectious bronchiolitis.   Stable lingular and left  lower lobe scarring.   1.2 by 0.9 cm part solid right lower lobe pulmonary nodule with cystic elements observed, solid element 0.5 by 0.3 cm on image 87 series 302 roughly similar to prior when slice selection is taken into account. Lesion not readily apparent on 2022 exams.   Upper Abdomen: Hepatic cysts. Cholecystectomy. Calcified porta hepatis/peripancreatic lymph nodes. Old granulomatous disease in the spleen. Abdominal aortic atherosclerosis. Scattered colonic diverticula.   Musculoskeletal:  Thoracic spondylosis.   IMPRESSION: 1. 1.2 by 0.9 cm part solid right lower lobe pulmonary nodule with cystic elements, solid element 0.5 by 0.3 cm, roughly similar to prior when slice selection is taken into account. Lesion not readily apparent on 2022 exams. From a morphologic standpoint this could be a low-grade adenocarcinoma. Annual CT is recommended until 5 years of stability has been established. If persistent these nodules should be considered highly suspicious if the solid component of the nodule is 6 mm or greater in size and enlarging. This recommendation follows the consensus statement: Guidelines for Management of Incidental Pulmonary Nodules Detected on CT Images: From the Fleischner Society 2017; Radiology 2017; 284:228-243. 2. Faint tree-in-bud reticulonodular opacities posteriorly in the right upper lobe favoring atypical infectious bronchiolitis. 3.  Aortic Atherosclerosis (ICD10-I70.0).     Electronically Signed   By: Ryan Salvage M.D.   On: 03/01/2024 15:17 I personally reviewed the CT images.  13 x 10 mm cystic nodule with variable wall thickness in base of right lower lobe.  No mediastinal or hilar adenopathy.  Coronary and aortic atherosclerosis.  Impression: Kevin Blake is a 78 year old man with a history of tobacco use, hypertension, hyperlipidemia, coronary disease, stent in 1997, abdominal aneurysm, stent graft 2019, reflux, skin cancer, arthritis, ventral hernia, and a right lower lobe lung nodule.  Right lower lobe lung nodule-cystic nodule base of right lower lobe.  Concerning for possible primary bronchogenic carcinoma.  No interval change over past 3 months.  I reviewed the CT images with [name].  I made it clear that the lack of change over the past 3 months does not rule out the possibility of cancer.  I do not think a PET scan will be particularly helpful in the setting because of the small size of the nodule with very minimal  solid component.  We discussed 3 potential options.  One is continued radiographic observation.  Second option would be a robotic bronchoscopy for biopsy.  The third option would be to do do a wedge resection for definitive diagnosis and treatment.  That would require navigational bronchoscopy to mark the nodule prior to resection.  We discussed the relative advantages and disadvantages of each approach.  He very strongly favors radiographic observation and does not want to proceed with surgery at this time.  Plan: Return in 3 months with CT chest  I spent over 20 minutes in review of records, images, and consultation with Kevin Blake today. Elspeth JAYSON Millers, MD [facility] [phone]

## 2024-05-17 ENCOUNTER — Other Ambulatory Visit: Payer: Self-pay | Admitting: Cardiovascular Disease

## 2024-05-18 ENCOUNTER — Other Ambulatory Visit: Payer: Self-pay | Admitting: Thoracic Surgery (Cardiothoracic Vascular Surgery)

## 2024-05-18 DIAGNOSIS — R911 Solitary pulmonary nodule: Secondary | ICD-10-CM

## 2024-05-19 ENCOUNTER — Telehealth: Payer: Self-pay | Admitting: Pharmacy Technician

## 2024-05-19 ENCOUNTER — Other Ambulatory Visit (HOSPITAL_COMMUNITY): Payer: Self-pay

## 2024-05-19 NOTE — Telephone Encounter (Signed)
° °  Pharmacy Patient Advocate Encounter   Received notification from CoverMyMeds that prior authorization for NEXLIZET is required/requested.   Insurance verification completed.   The patient is insured through [employer].   Per test claim: PA required; PA submitted to above mentioned insurance via Latent Key/confirmation #/EOC [id number] Status is pending

## 2024-05-19 NOTE — Telephone Encounter (Signed)
 Pharmacy Patient Advocate Encounter  Received notification from [employer] that Prior Authorization for nexlizet has been APPROVED from 05/19/24 to 06/07/25   PA #/Case ID/Reference #: [id number]    pt does not want to take Nexlizet due to pt read side effects can cause tendon damage.

## 2024-06-05 ENCOUNTER — Ambulatory Visit (HOSPITAL_COMMUNITY)
Admission: RE | Admit: 2024-06-05 | Discharge: 2024-06-05 | Disposition: A | Discharge status: Home / Self Care | Source: Ambulatory Visit | Attending: Thoracic Surgery (Cardiothoracic Vascular Surgery) | Admitting: Thoracic Surgery (Cardiothoracic Vascular Surgery)

## 2024-06-05 DIAGNOSIS — R911 Solitary pulmonary nodule: Secondary | ICD-10-CM | POA: Diagnosis present

## 2024-06-12 ENCOUNTER — Ambulatory Visit (HOSPITAL_COMMUNITY)

## 2024-06-18 ENCOUNTER — Other Ambulatory Visit: Payer: Self-pay | Admitting: Cardiovascular Disease

## 2024-06-20 ENCOUNTER — Ambulatory Visit
Attending: Thoracic Surgery (Cardiothoracic Vascular Surgery) | Admitting: Thoracic Surgery (Cardiothoracic Vascular Surgery)

## 2024-06-20 ENCOUNTER — Encounter: Payer: Self-pay | Admitting: Thoracic Surgery (Cardiothoracic Vascular Surgery)

## 2024-06-20 VITALS — BP 168/90 | HR 55 | Resp 18 | Ht 67.0 in | Wt 169.0 lb

## 2024-06-20 DIAGNOSIS — R911 Solitary pulmonary nodule: Secondary | ICD-10-CM

## 2024-06-20 NOTE — Progress Notes (Signed)
 "  [address] [place] [address]             [phone]    HPI: Kevin Blake returns for follow-up of a right lower lobe lung nodule.  Kevin Blake is an 79 year old man with a history of tobacco use, hypertension, hyperlipidemia, CAD, coronary stent, abdominal aneurysm, abdominal stent graft, reflux, skin cancer, arthritis, ventral hernia, and a right lower lobe lung nodule.  He has a 35 pack-year history of smoking.  He quit about 30 years ago.  In June 2025 he had a CT of his chest which showed a 10 x 13 mm cystic lesion with variable wall thickness in the right lower lobe.  A follow-up CT in September showed the lesion was stable.  I recommended resection but he declined and wanted to continue with radiographic observation.  He now returns for a 81-month follow-up.  He has been feeling well.  No fevers, chills, sweats, chest pain, pressure, tightness, or shortness of breath.  Denies cough and wheezing.  Patient denies any aspiration but has been exposed to sawdust and leaf dust.  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        Evolocumab  (REPATHA  SURECLICK) 140 MG/ML SOAJ Inject 140 mg into the skin every 14 (fourteen) days. 6 mL 3   ezetimibe  (ZETIA ) 10 MG tablet Take 1 tablet (10 mg total) by mouth daily. 30 tablet 11   losartan  (COZAAR ) 50 MG tablet TAKE 1 TABLET(50 MG) BY MOUTH DAILY 15 tablet 0   nitroGLYCERIN  (NITROSTAT ) 0.4 MG SL tablet Place 1 tablet (0.4 mg total) under the tongue every 5 (five) minutes as needed. 25 tablet 3   rosuvastatin  (CRESTOR ) 20 MG tablet Take 0.5 tablets (10 mg total) by mouth daily.     No current facility-administered medications for this visit.    Physical Exam BP (!) 168/90 (BP Location: Right Arm)   Pulse (!) 55   Resp 18   Ht 5' 7 (1.702 m)   Wt 169 lb (76.7 kg)   SpO2 96%   BMI 26.47 kg/m  Well-appearing 79 year old man in no acute distress in no acute distress Alert and oriented x 3 with no focal deficits Lungs clear bilaterally Cardiac regular rate and rhythm No clubbing, cyanosis, or edema  Diagnostic Tests: CT CHEST WITHOUT CONTRAST 06/05/2024 01:32:51 PM   TECHNIQUE: CT of the chest was performed without the administration of intravenous contrast. Multiplanar reformatted images are provided for review. Automated exposure control, iterative reconstruction, and/or weight based adjustment of the mA/kV was utilized to reduce the radiation dose to as low as reasonably achievable.   COMPARISON: 03/01/2024   CLINICAL HISTORY: Lung nodule, 6-12mm. *  Tracking Code: BO *   FINDINGS:   MEDIASTINUM: 3 vessel coronary atherosclerosis. Pericardium is unremarkable. The central airways are clear. Atherosclerotic nonaneurysmal thoracic aorta. Normal caliber main pulmonary artery.   LYMPH NODES: Stable calcified porta hepatis lymph nodes. No mediastinal, hilar or axillary lymphadenopathy.   LUNGS AND PLEURA: Small focus of mild patchy consolidation and ground glass opacity in the posterior right upper lobe on image 26 is largely new, correlating with the site of mild patchy  tree and bud opacity on the prior chest CT. Mixed cystic and solid 1.1 x 0.9 cm right lower lobe pulmonary nodule with 0.4 cm solid component on image 72, previously 1.1 x 0.9 cm with 0.4 cm solid component on 03/01/2024 chest CT, unchanged in the interval and unchanged since 11/19/2023 chest CT, although not clearly visualized on baseline 2014 chest CT angiogram study as previously described. No new significant pulmonary nodules. No pleural effusion or pneumothorax.   SOFT TISSUES/BONES: Moderate thoracic spondylosis. No acute abnormality of the soft tissues.   UPPER ABDOMEN: Cholecystectomy. Numerous benign liver cysts scattered throughout the liver, large cyst 3.5 cm in the posterior right liver. Partially visualized lateral upper right renal 4.3 cm cyst. Granulomatous calcifications throughout the spleen and liver. Colonic diverticulosis. Limited images of the upper abdomen demonstrates no acute abnormality.   IMPRESSION: 1. Stable mixed cystic and solid 1.1 x 0.9 cm right lower lobe pulmonary nodule with 0.4 cm solid component, unchanged since 11/19/2023, not definitely present on baseline 2014 chest CT. Indolent primary bronchogenic adenocarcinoma remains on the differential. Continued chest CT surveillance suggested at a minimum. 2. New small focus of mild patchy consolidation and ground glass opacity in the posterior right upper lobe, favor infectious/inflammatory. Suggest attention on future follow-up chest CTs. 3. Three-vessel coronary atherosclerosis. 4. Colonic diverticulosis. 5. Aortic Atherosclerosis (ICD10-I70.0).   Electronically signed by: Selinda Blue MD 06/05/2024 02:49 PM EST RP Workstation: [id number] I personally reviewed the CT images.  No change in the right lower lobe cystic/solid nodule, new area of consolidation and ground glass opacity in the posterior right upper lobe.  Impression: Kevin Blake is an 79 year old man with a history of tobacco use,  hypertension, hyperlipidemia, CAD, coronary stent, abdominal aneurysm, abdominal stent graft, reflux, skin cancer, arthritis, ventral hernia, and a right lower lobe lung nodule.  Right lower lobe nodule-cystic lesion with solid wall of variable thickness.  Infectious and inflammatory nodules are in the differential but there is concern that this could represent a primary bronchogenic carcinoma.  No change over the past 6 months is reassuring but does not rule out the possibility of cancer.  New right upper lobe opacity-in the area of previous tree-in-bud nodularity.  Raises concern for possible aspiration but he and his wife both deny any signs or symptoms of that when he is eating or drinking.  Will need to be followed along with the right lower lobe nodule.  Plan: Return in 3 months with CT chest  I spent over 20 minutes in review of records, images, and in consultation with Kevin Blake today.  Elspeth JAYSON Millers, MD [facility] [phone]     "

## 2024-07-01 ENCOUNTER — Telehealth: Payer: Self-pay | Admitting: Cardiovascular Disease

## 2024-07-06 ENCOUNTER — Telehealth: Payer: Self-pay | Admitting: Nurse Practitioner

## 2024-07-07 MED ORDER — EZETIMIBE 10 MG PO TABS: 10.0000 mg | ORAL_TABLET | Freq: Every day | ORAL | 0 refills | Status: AC

## 2024-07-07 NOTE — Telephone Encounter (Signed)
 Pt calling to f/u on refills and has 4 pills left. Pt scheduled 2/20.

## 2024-07-07 NOTE — Telephone Encounter (Signed)
30 days refill sent

## 2024-07-07 NOTE — Telephone Encounter (Signed)
 Labs on 11/26/23

## 2024-07-28 ENCOUNTER — Ambulatory Visit: Admitting: Cardiovascular Disease
